# Patient Record
Sex: Female | Born: 1980 | Race: Black or African American | Hispanic: No | Marital: Single | Smoking: Current every day smoker
Health system: Southern US, Community
[De-identification: ages and names within clinical notes are randomized; demographics above are authoritative.]

## PROBLEM LIST (undated history)

## (undated) ENCOUNTER — Ambulatory Visit (HOSPITAL_COMMUNITY): Disposition: A | Discharge status: Home / Self Care

## (undated) DIAGNOSIS — G43909 Migraine, unspecified, not intractable, without status migrainosus: Secondary | ICD-10-CM

## (undated) DIAGNOSIS — F329 Major depressive disorder, single episode, unspecified: Secondary | ICD-10-CM

## (undated) DIAGNOSIS — F32A Depression, unspecified: Secondary | ICD-10-CM

## (undated) HISTORY — PX: FOOT TENDON SURGERY: SHX958

## (undated) HISTORY — PX: FOOT SURGERY: SHX648

## (undated) HISTORY — DX: Migraine, unspecified, not intractable, without status migrainosus: G43.909

## (undated) HISTORY — PX: CHOLECYSTECTOMY: SHX55

## (undated) HISTORY — PX: TOE SURGERY: SHX1073

---

## 2003-06-23 ENCOUNTER — Other Ambulatory Visit: Payer: Self-pay

## 2003-07-07 ENCOUNTER — Other Ambulatory Visit: Payer: Self-pay

## 2003-10-21 ENCOUNTER — Other Ambulatory Visit: Payer: Self-pay

## 2004-01-27 ENCOUNTER — Other Ambulatory Visit: Payer: Self-pay

## 2004-05-13 ENCOUNTER — Inpatient Hospital Stay: Payer: Self-pay | Admitting: Psychiatry

## 2004-09-04 ENCOUNTER — Emergency Department: Payer: Self-pay | Admitting: Emergency Medicine

## 2004-11-01 ENCOUNTER — Emergency Department: Payer: Self-pay | Admitting: Internal Medicine

## 2004-12-30 ENCOUNTER — Emergency Department: Payer: Self-pay | Admitting: Unknown Physician Specialty

## 2005-02-22 ENCOUNTER — Emergency Department: Payer: Self-pay | Admitting: Emergency Medicine

## 2005-03-08 ENCOUNTER — Emergency Department: Payer: Self-pay | Admitting: Emergency Medicine

## 2005-04-07 ENCOUNTER — Ambulatory Visit: Payer: Self-pay | Admitting: Podiatry

## 2005-04-11 ENCOUNTER — Emergency Department: Payer: Self-pay | Admitting: Emergency Medicine

## 2005-11-19 ENCOUNTER — Emergency Department: Payer: Self-pay | Admitting: Emergency Medicine

## 2005-11-20 ENCOUNTER — Emergency Department: Payer: Self-pay | Admitting: Emergency Medicine

## 2007-03-04 ENCOUNTER — Emergency Department: Payer: Self-pay | Admitting: Emergency Medicine

## 2007-05-31 ENCOUNTER — Emergency Department: Payer: Self-pay | Admitting: Unknown Physician Specialty

## 2007-07-16 ENCOUNTER — Emergency Department: Payer: Self-pay | Admitting: Emergency Medicine

## 2007-07-22 ENCOUNTER — Inpatient Hospital Stay: Payer: Self-pay | Admitting: Unknown Physician Specialty

## 2007-09-02 ENCOUNTER — Observation Stay: Payer: Self-pay | Admitting: Obstetrics & Gynecology

## 2007-10-12 ENCOUNTER — Observation Stay: Payer: Self-pay | Admitting: Unknown Physician Specialty

## 2007-11-01 ENCOUNTER — Observation Stay: Payer: Self-pay

## 2007-11-07 ENCOUNTER — Ambulatory Visit: Payer: Self-pay | Admitting: Family Medicine

## 2008-01-06 ENCOUNTER — Inpatient Hospital Stay: Payer: Self-pay

## 2008-08-09 ENCOUNTER — Emergency Department: Payer: Self-pay | Admitting: Emergency Medicine

## 2008-09-05 ENCOUNTER — Emergency Department: Payer: Self-pay | Admitting: Emergency Medicine

## 2008-11-06 ENCOUNTER — Emergency Department: Payer: Self-pay | Admitting: Internal Medicine

## 2008-12-03 IMAGING — US US OB < 14 WEEKS - US OB TV
1 series · 17 of 26 positions shown · non-contrast
Comparison: none

REASON FOR EXAM: Pelvic pain
COMMENTS:

[Series 1: us ob < 14 weeks - us ob tv · 17 of 26 slices shown]
[im 1/26]
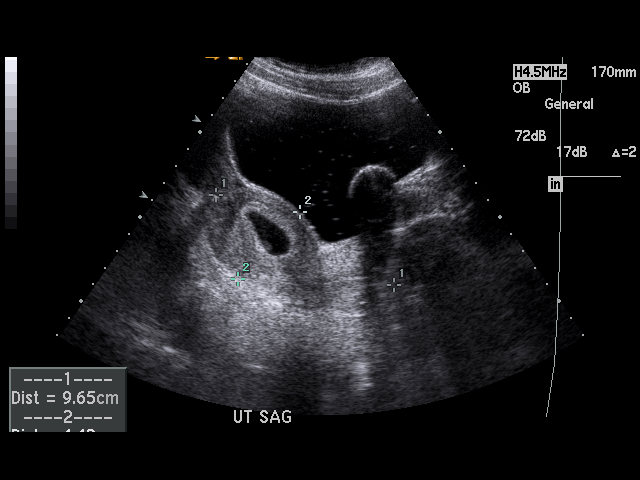
[im 3/26]
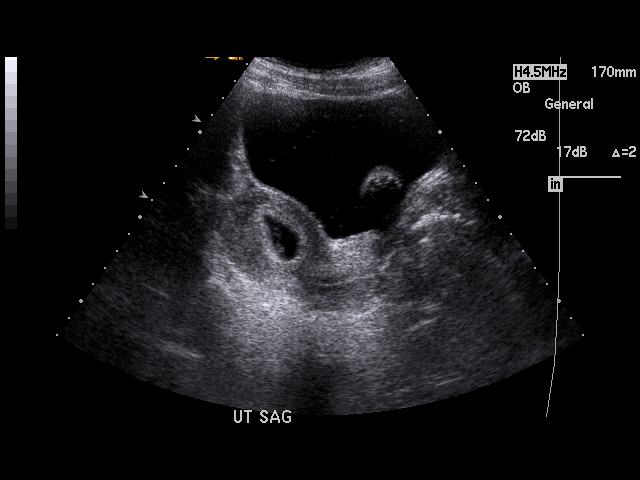
[im 4/26]
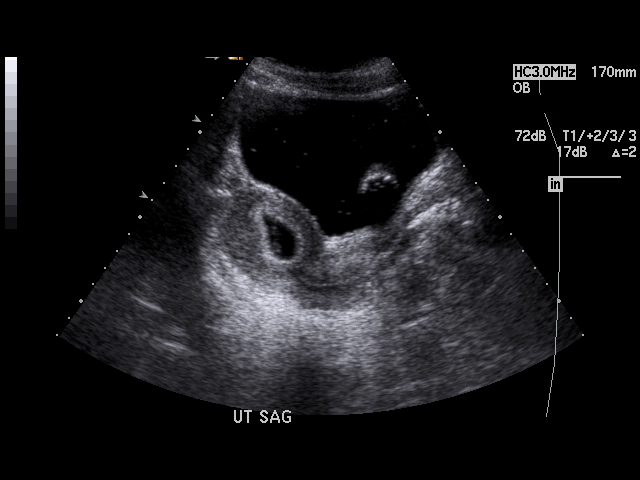
[im 6/26]
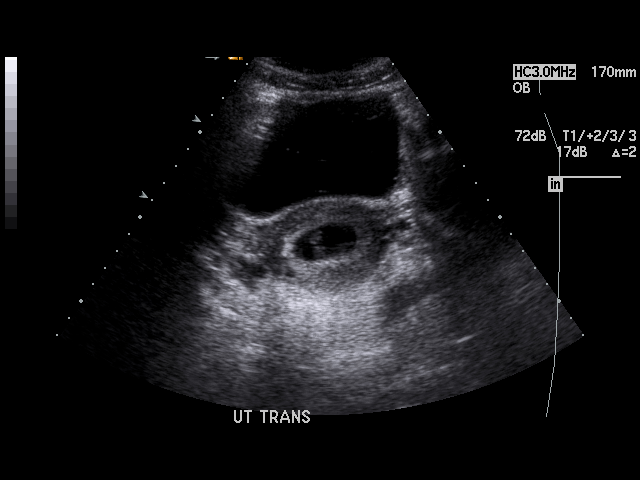
[im 7/26]
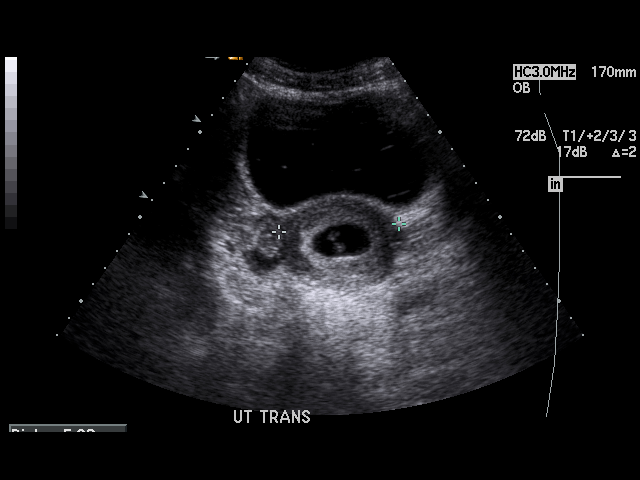
[im 9/26]
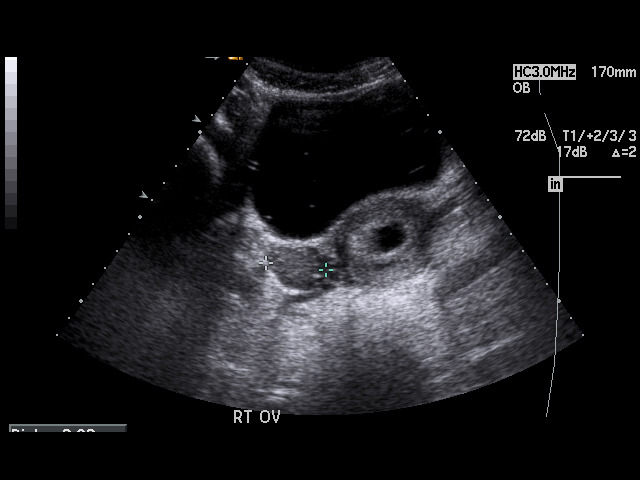
[im 10/26]
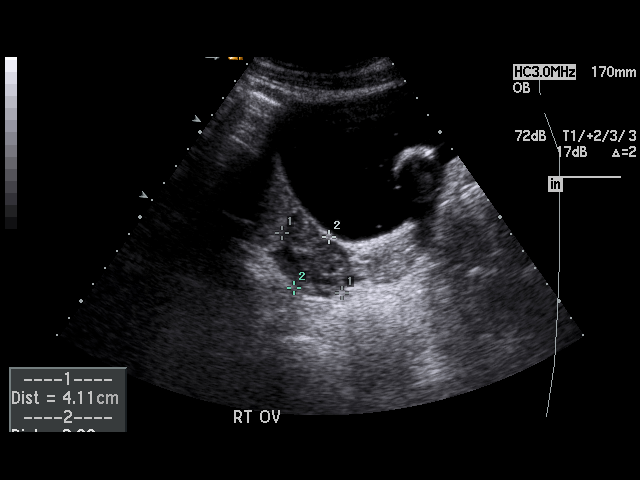
[im 12/26]
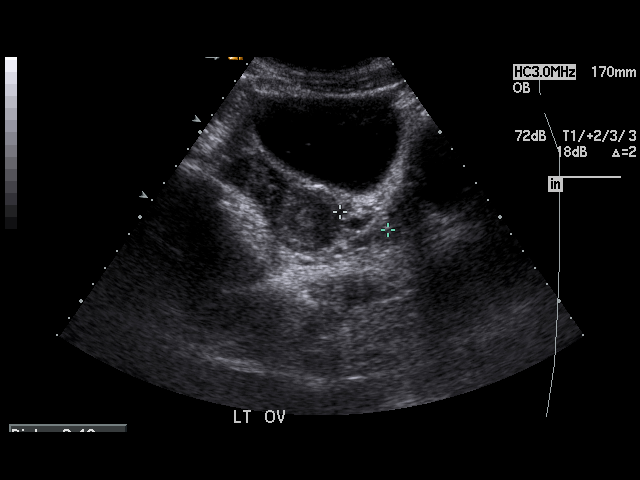
[im 14/26]
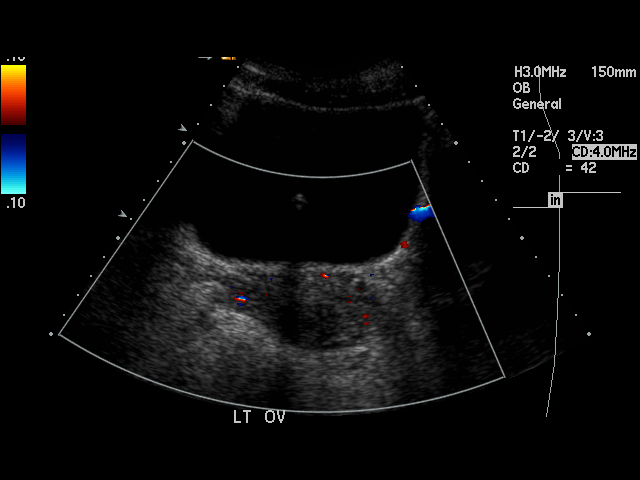
[im 15/26]
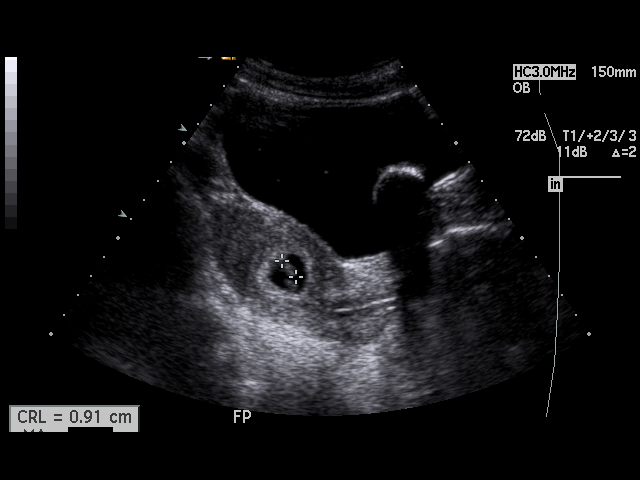
[im 17/26]
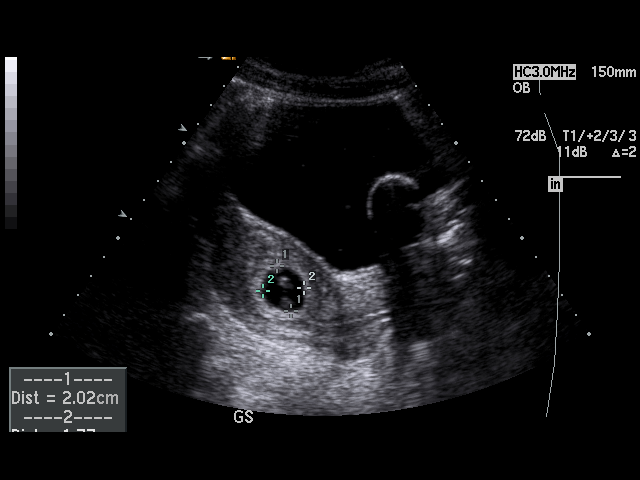
[im 18/26]
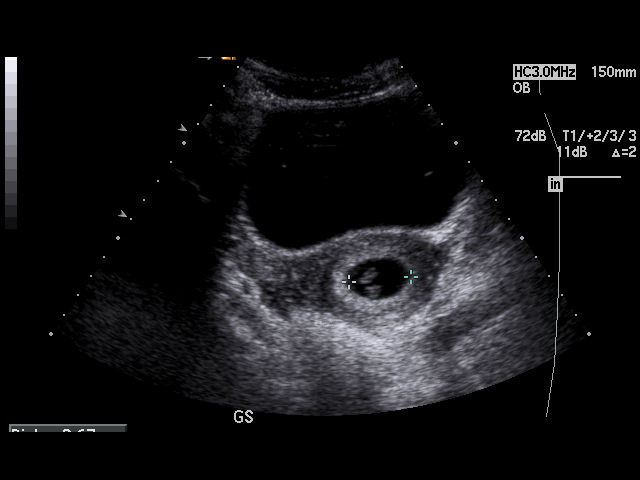
[im 20/26]
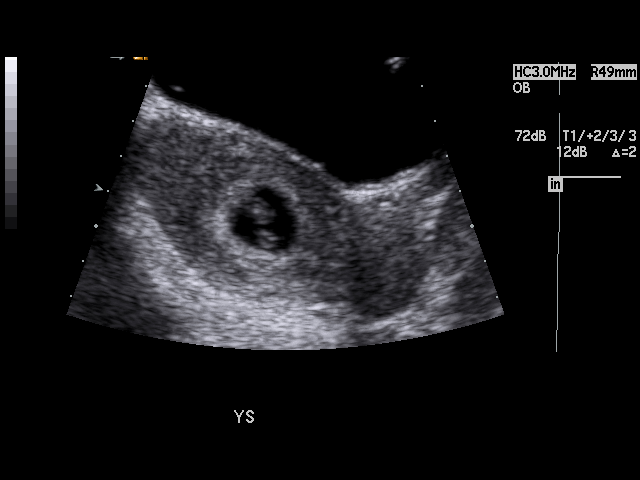
[im 21/26]
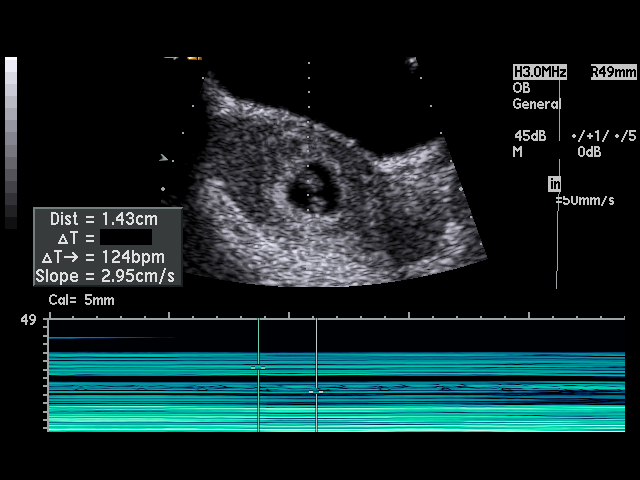
[im 23/26]
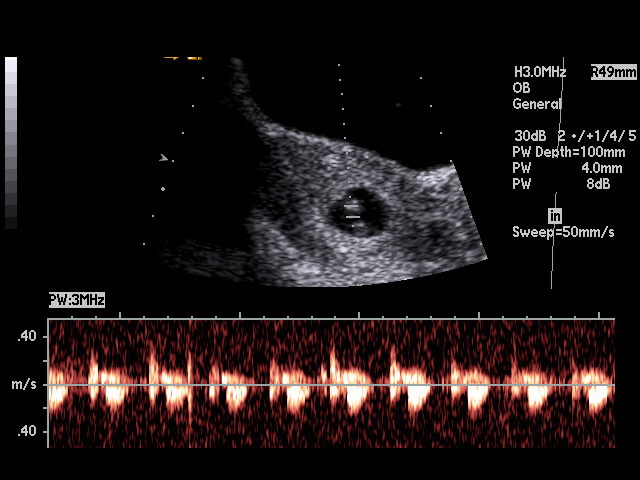
[im 24/26]
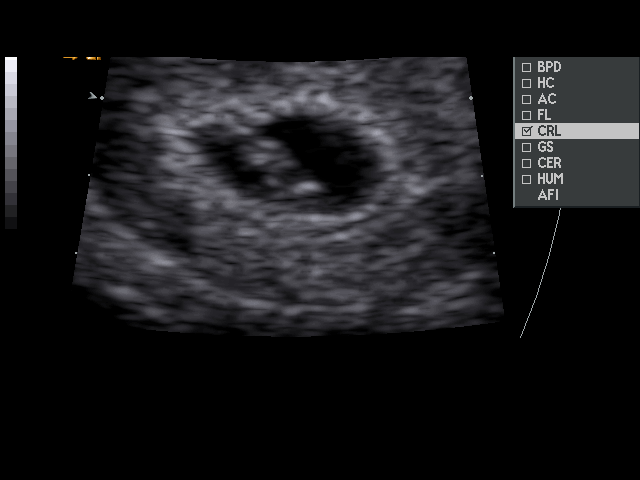
[im 26/26]
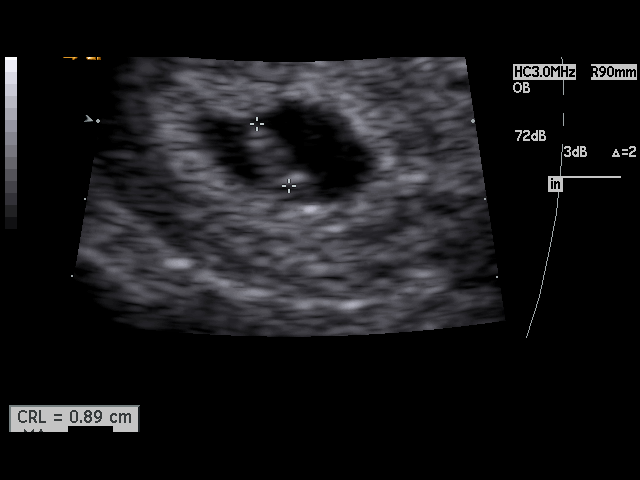

[17 of 26 positions shown; findings below may reference images not displayed]

PROCEDURE:     US  - US OB LESS THAN 14 WEEKS  - May 31, 2007 [DATE]

RESULT:     There is observed a single living intrauterine gestation. Embryo
heart rate was monitored at 124 beats per minute. The crown-rump length
measures 0.91 cm which corresponds to an estimated gestational age of 7
weeks, 0 days. Ultrasound EDD is 01/17/2008.

The maternal ovaries are visualized and are normal in appearance. No
abnormal adnexal masses are seen. There is no free fluid in the pelvis. The
visualized portion of the urinary bladder shows no significant abnormalities.
IMPRESSION: 1.  Living intrauterine gestation of approximately 7 weeks, 0 days
gestational age.
2.  Embryo heart rate was monitored at 124 beats per minute.

## 2009-05-02 ENCOUNTER — Observation Stay: Payer: Self-pay | Admitting: Obstetrics and Gynecology

## 2009-05-23 ENCOUNTER — Inpatient Hospital Stay: Payer: Self-pay

## 2009-06-01 ENCOUNTER — Emergency Department: Payer: Self-pay | Admitting: Emergency Medicine

## 2009-08-04 ENCOUNTER — Emergency Department: Payer: Self-pay | Admitting: Emergency Medicine

## 2009-08-12 ENCOUNTER — Ambulatory Visit: Payer: Self-pay | Admitting: Surgery

## 2009-08-19 ENCOUNTER — Ambulatory Visit: Payer: Self-pay | Admitting: Surgery

## 2009-09-08 ENCOUNTER — Emergency Department: Payer: Self-pay | Admitting: Emergency Medicine

## 2010-01-31 ENCOUNTER — Emergency Department: Payer: Self-pay | Admitting: Emergency Medicine

## 2010-04-25 ENCOUNTER — Observation Stay: Payer: Self-pay | Admitting: Psychiatry

## 2011-08-05 ENCOUNTER — Ambulatory Visit: Payer: Self-pay | Admitting: Specialist

## 2012-01-31 ENCOUNTER — Emergency Department (HOSPITAL_COMMUNITY)
Admission: EM | Admit: 2012-01-31 | Discharge: 2012-01-31 | Disposition: A | Discharge status: Home / Self Care | Payer: Medicaid Other | Attending: Emergency Medicine | Admitting: Emergency Medicine

## 2012-01-31 ENCOUNTER — Encounter (HOSPITAL_COMMUNITY): Payer: Self-pay | Admitting: Emergency Medicine

## 2012-01-31 DIAGNOSIS — R110 Nausea: Secondary | ICD-10-CM | POA: Insufficient documentation

## 2012-01-31 DIAGNOSIS — R109 Unspecified abdominal pain: Secondary | ICD-10-CM

## 2012-01-31 DIAGNOSIS — F329 Major depressive disorder, single episode, unspecified: Secondary | ICD-10-CM | POA: Insufficient documentation

## 2012-01-31 DIAGNOSIS — Z9089 Acquired absence of other organs: Secondary | ICD-10-CM | POA: Insufficient documentation

## 2012-01-31 DIAGNOSIS — F3289 Other specified depressive episodes: Secondary | ICD-10-CM | POA: Insufficient documentation

## 2012-01-31 HISTORY — DX: Depression, unspecified: F32.A

## 2012-01-31 HISTORY — DX: Major depressive disorder, single episode, unspecified: F32.9

## 2012-01-31 LAB — DIFFERENTIAL
Basophils Absolute: 0 10*3/uL (ref 0.0–0.1)
Basophils Relative: 0 % (ref 0–1)
Lymphocytes Relative: 27 % (ref 12–46)
Neutro Abs: 3.6 10*3/uL (ref 1.7–7.7)
Neutrophils Relative %: 62 % (ref 43–77)

## 2012-01-31 LAB — COMPREHENSIVE METABOLIC PANEL
Albumin: 3.9 g/dL (ref 3.5–5.2)
Alkaline Phosphatase: 77 U/L (ref 39–117)
BUN: 7 mg/dL (ref 6–23)
Chloride: 104 mEq/L (ref 96–112)
Creatinine, Ser: 0.77 mg/dL (ref 0.50–1.10)
GFR calc Af Amer: >90 mL/min (ref >90)
Glucose, Bld: 94 mg/dL (ref 70–99)
Potassium: 4 mEq/L (ref 3.5–5.1)
Total Bilirubin: 0.3 mg/dL (ref 0.3–1.2)
Total Protein: 7.4 g/dL (ref 6.0–8.3)

## 2012-01-31 LAB — CBC
HCT: 38.1 % (ref 36.0–46.0)
MCHC: 33.3 g/dL (ref 30.0–36.0)
RDW: 11.9 % (ref 11.5–15.5)
WBC: 5.8 10*3/uL (ref 4.0–10.5)

## 2012-01-31 LAB — URINALYSIS, ROUTINE W REFLEX MICROSCOPIC
Bilirubin Urine: NEGATIVE
Ketones, ur: NEGATIVE mg/dL
Leukocytes, UA: NEGATIVE
Nitrite: NEGATIVE
Protein, ur: NEGATIVE mg/dL
pH: 7 (ref 5.0–8.0)

## 2012-01-31 LAB — LIPASE, BLOOD: Lipase: 31 U/L (ref 11–59)

## 2012-01-31 MED ORDER — ONDANSETRON HCL 4 MG PO TABS: 8.0000 mg | ORAL_TABLET | Freq: Four times a day (QID) | ORAL | Status: AC

## 2012-01-31 MED ORDER — ONDANSETRON 4 MG PO TBDP: 8.0000 mg | ORAL_TABLET | Freq: Once | ORAL | Status: DC

## 2012-01-31 NOTE — ED Provider Notes (Signed)
History     CSN: [id number]  Arrival date & time 01/31/12  1423   First MD Initiated Contact with Patient 01/31/12 1716      Chief Complaint  Patient presents with  . Abdominal Pain    (Consider location/radiation/quality/duration/timing/severity/associated sxs/prior treatment) HPI Complains of right flank pain onset 2 AM today. Symptoms accompanied by nausea patient has been getting similar pains for 15 years. She reports had her gallbladder taken out in 2011 however continues to get similar pain since. She admits to nausea no vomiting. Last bowel movement yesterday, normal last menstrual period 2008. She feels much improved today over yesterday similar prior episodes resolve spontaneously after approximately one day. Her treatment prior to coming here no other associated symptoms pain is sharp in quality mild at present Past Medical History  Diagnosis Date  . Depression     History reviewed. No pertinent past surgical history. Past surgical Cholecystectomy No family history on file.  History  Substance Use Topics  . Smoking status: Passive Smoker    Types: Cigarettes  . Smokeless tobacco: Not on file  . Alcohol Use: No    OB History    Grav Para Term Preterm Abortions TAB SAB Ect Mult Living                  Review of Systems  Constitutional: Negative.   HENT: Negative.   Respiratory: Negative.   Cardiovascular: Negative.   Gastrointestinal: Positive for nausea.  Genitourinary: Positive for flank pain.       Amenorrhea as she is on Mirena, IUD  Musculoskeletal: Negative.   Skin: Negative.   Neurological: Negative.   Hematological: Negative.   Psychiatric/Behavioral: Negative.   All other systems reviewed and are negative.    Allergies  Keflex  Home Medications   Current Outpatient Rx  Name Route Sig Dispense Refill  . QUETIAPINE FUMARATE 100 MG PO TABS Oral Take 100 mg by mouth at bedtime.    . VENLAFAXINE HCL ER 150 MG PO CP24 Oral Take 150 mg by  mouth every morning.      BP 104/64  Pulse 80  Temp 98.2 F (36.8 C) (Oral)  Resp 16  SpO2 98%  Physical Exam  Nursing note and vitals reviewed. Constitutional: She appears well-developed and well-nourished.  HENT:  Head: Normocephalic and atraumatic.  Eyes: Conjunctivae are normal. Pupils are equal, round, and reactive to light.  Neck: Neck supple. No tracheal deviation present. No thyromegaly present.  Cardiovascular: Normal rate and regular rhythm.   No murmur heard. Pulmonary/Chest: Effort normal and breath sounds normal.  Abdominal: Soft. Bowel sounds are normal. She exhibits no distension. There is no tenderness.  Genitourinary:       Mild right flank tenderness  Musculoskeletal: Normal range of motion. She exhibits no edema and no tenderness.  Neurological: She is alert. Coordination normal.  Skin: Skin is warm and dry. No rash noted.  Psychiatric: She has a normal mood and affect.    ED Course  Procedures (including critical care time) 7:05 PM patient resting comfortably discomfort is minimal without having received any treatment.(She declined pain medicine or antiemetic treatment in the ED)  Labs Reviewed  URINALYSIS, ROUTINE W REFLEX MICROSCOPIC   No results found. Results for orders placed during the hospital encounter of 01/31/12  URINALYSIS, ROUTINE W REFLEX MICROSCOPIC      Component Value Range   Color, Urine YELLOW  YELLOW   APPearance CLEAR  CLEAR   Specific Gravity, Urine 1.010  1.005 -  1.030   pH 7.0  5.0 - 8.0   Glucose, UA NEGATIVE  NEGATIVE mg/dL   Hgb urine dipstick NEGATIVE  NEGATIVE   Bilirubin Urine NEGATIVE  NEGATIVE   Ketones, ur NEGATIVE  NEGATIVE mg/dL   Protein, ur NEGATIVE  NEGATIVE mg/dL   Urobilinogen, UA 0.2  0.0 - 1.0 mg/dL   Nitrite NEGATIVE  NEGATIVE   Leukocytes, UA NEGATIVE  NEGATIVE  PREGNANCY, URINE      Component Value Range   Preg Test, Ur NEGATIVE  NEGATIVE  COMPREHENSIVE METABOLIC PANEL      Component Value Range    Sodium 138  135 - 145 mEq/L   Potassium 4.0  3.5 - 5.1 mEq/L   Chloride 104  96 - 112 mEq/L   CO2 25  19 - 32 mEq/L   Glucose, Bld 94  70 - 99 mg/dL   BUN 7  6 - 23 mg/dL   Creatinine, Ser 1.61  0.50 - 1.10 mg/dL   Calcium 9.1  8.4 - 09.6 mg/dL   Total Protein 7.4  6.0 - 8.3 g/dL   Albumin 3.9  3.5 - 5.2 g/dL   AST 20  0 - 37 U/L   ALT 7  0 - 35 U/L   Alkaline Phosphatase 77  39 - 117 U/L   Total Bilirubin 0.3  0.3 - 1.2 mg/dL   GFR calc non Af Amer >90  >90 mL/min   GFR calc Af Amer >90  >90 mL/min  LIPASE, BLOOD      Component Value Range   Lipase 31  11 - 59 U/L  CBC      Component Value Range   WBC 5.8  4.0 - 10.5 K/uL   RBC 4.07  3.87 - 5.11 MIL/uL   Hemoglobin 12.7  12.0 - 15.0 g/dL   HCT 04.5  40.9 - 81.1 %   MCV 93.6  78.0 - 100.0 fL   MCH 31.2  26.0 - 34.0 pg   MCHC 33.3  30.0 - 36.0 g/dL   RDW 91.4  78.2 - 95.6 %   Platelets 214  150 - 400 K/uL  DIFFERENTIAL      Component Value Range   Neutrophils Relative 62  43 - 77 %   Neutro Abs 3.6  1.7 - 7.7 K/uL   Lymphocytes Relative 27  12 - 46 %   Lymphs Abs 1.6  0.7 - 4.0 K/uL   Monocytes Relative 9  3 - 12 %   Monocytes Absolute 0.5  0.1 - 1.0 K/uL   Eosinophils Relative 2  0 - 5 %   Eosinophils Absolute 0.1  0.0 - 0.7 K/uL   Basophils Relative 0  0 - 1 %   Basophils Absolute 0.0  0.0 - 0.1 K/uL   No results found.   No diagnosis found.    MDM  Assessment symptoms are chronic and long-standing and have been self-limiting in the past and are improving spontaneously Plan prescription Zofran Tylenol as needed for pain followup Dr. Kenard Gower as needed Diagnosis right flank pain        Doug Sou, MD 01/31/12 1913

## 2012-01-31 NOTE — ED Notes (Signed)
Pt. Stated, I started having horrible stomach pains last night and I've already had my gall bladder removed.

## 2012-01-31 NOTE — Discharge Instructions (Signed)
Take Tylenol as directed for pain. Take the medication prescribed as needed for nausea. Return if her condition worsens or if concerned for any reason or followup with Dr. Kenard Gower

## 2012-01-31 NOTE — ED Notes (Signed)
Pt denies nausea at this time.  Requesting something to eat.

## 2012-09-04 LAB — HM HIV SCREENING LAB: HM HIV Screening: NEGATIVE

## 2013-10-29 LAB — HM PAP SMEAR: HM Pap smear: NEGATIVE

## 2013-11-14 ENCOUNTER — Emergency Department: Payer: Self-pay | Admitting: Emergency Medicine

## 2013-11-16 LAB — BETA STREP CULTURE(ARMC)

## 2014-01-30 ENCOUNTER — Ambulatory Visit: Payer: Self-pay | Admitting: Family Medicine

## 2015-09-04 ENCOUNTER — Emergency Department
Admission: EM | Admit: 2015-09-04 | Discharge: 2015-09-04 | Disposition: A | Discharge status: Home / Self Care | Payer: Medicaid Other | Attending: Emergency Medicine | Admitting: Emergency Medicine

## 2015-09-04 ENCOUNTER — Encounter: Payer: Self-pay | Admitting: Emergency Medicine

## 2015-09-04 ENCOUNTER — Emergency Department: Payer: Medicaid Other

## 2015-09-04 DIAGNOSIS — K5900 Constipation, unspecified: Secondary | ICD-10-CM | POA: Diagnosis not present

## 2015-09-04 DIAGNOSIS — Z79899 Other long term (current) drug therapy: Secondary | ICD-10-CM | POA: Insufficient documentation

## 2015-09-04 DIAGNOSIS — F17210 Nicotine dependence, cigarettes, uncomplicated: Secondary | ICD-10-CM | POA: Diagnosis not present

## 2015-09-04 DIAGNOSIS — Z3202 Encounter for pregnancy test, result negative: Secondary | ICD-10-CM | POA: Diagnosis not present

## 2015-09-04 DIAGNOSIS — R1084 Generalized abdominal pain: Secondary | ICD-10-CM

## 2015-09-04 DIAGNOSIS — R109 Unspecified abdominal pain: Secondary | ICD-10-CM | POA: Diagnosis present

## 2015-09-04 LAB — URINALYSIS COMPLETE WITH MICROSCOPIC (ARMC ONLY)
BILIRUBIN URINE: NEGATIVE
GLUCOSE, UA: NEGATIVE mg/dL
HGB URINE DIPSTICK: NEGATIVE
Ketones, ur: NEGATIVE mg/dL
NITRITE: NEGATIVE
Protein, ur: NEGATIVE mg/dL
RBC / HPF: NONE SEEN RBC/hpf (ref 0–5)
SPECIFIC GRAVITY, URINE: 1.014 (ref 1.005–1.030)
pH: 6 (ref 5.0–8.0)

## 2015-09-04 MED ORDER — POLYETHYLENE GLYCOL 3350 17 G PO PACK: 17.0000 g | PACK | Freq: Every day | ORAL | Status: DC | PRN

## 2015-09-04 MED ORDER — DOCUSATE SODIUM 100 MG PO CAPS
100.0000 mg | ORAL_CAPSULE | Freq: Once | ORAL | Status: AC
  Administered 2015-09-04: 100 mg via ORAL
  Filled 2015-09-04: qty 1

## 2015-09-04 MED ORDER — LACTULOSE 10 GM/15ML PO SOLN: 10.0000 g | Freq: Once | ORAL | Status: DC

## 2015-09-04 MED ORDER — MAGNESIUM CITRATE PO SOLN
0.5000 | Freq: Once | ORAL | Status: AC
  Administered 2015-09-04: 0.5 via ORAL
  Filled 2015-09-04: qty 296

## 2015-09-04 NOTE — ED Notes (Signed)
POCT urine pregnancy negative 

## 2015-09-04 NOTE — ED Notes (Signed)
Pt reports large bowel movement after SSE.

## 2015-09-04 NOTE — Discharge Instructions (Signed)
Abdominal Pain, Adult °Many things can cause abdominal pain. Usually, abdominal pain is not caused by a disease and will improve without treatment. It can often be observed and treated at home. Your health care provider will do a physical exam and possibly order blood tests and X-rays to help determine the seriousness of your pain. However, in many cases, more time must pass before a clear cause of the pain can be found. Before that point, your health care provider may not know if you need more testing or further treatment. °HOME CARE INSTRUCTIONS °Monitor your abdominal pain for any changes. The following actions may help to alleviate any discomfort you are experiencing: °· Only take over-the-counter or prescription medicines as directed by your health care provider. °· Do not take laxatives unless directed to do so by your health care provider. °· Try a clear liquid diet (broth, tea, or water) as directed by your health care provider. Slowly move to a bland diet as tolerated. °SEEK MEDICAL CARE IF: °· You have unexplained abdominal pain. °· You have abdominal pain associated with nausea or diarrhea. °· You have pain when you urinate or have a bowel movement. °· You experience abdominal pain that wakes you in the night. °· You have abdominal pain that is worsened or improved by eating food. °· You have abdominal pain that is worsened with eating fatty foods. °· You have a fever. °SEEK IMMEDIATE MEDICAL CARE IF: °· Your pain does not go away within 2 hours. °· You keep throwing up (vomiting). °· Your pain is felt only in portions of the abdomen, such as the right side or the left lower portion of the abdomen. °· You pass bloody or black tarry stools. °MAKE SURE YOU: °· Understand these instructions. °· Will watch your condition. °· Will get help right away if you are not doing well or get worse. °  °This information is not intended to replace advice given to you by your health care provider. Make sure you discuss  any questions you have with your health care provider. °  °Document Released: 05/11/2005 Document Revised: 04/22/2015 Document Reviewed: 04/10/2013 °Elsevier Interactive Patient Education ©2016 [employer] ° °Constipation, Adult °Constipation is when a person: °· Poops (has a bowel movement) less than 3 times a week. °· Has a hard time pooping. °· Has poop that is dry, hard, or bigger than normal. °HOME CARE  °· Eat foods with a lot of fiber in them. This includes fruits, vegetables, beans, and whole grains such as brown rice. °· Avoid fatty foods and foods with a lot of sugar. This includes french fries, hamburgers, cookies, candy, and soda. °· If you are not getting enough fiber from food, take products with added fiber in them (supplements). °· Drink enough fluid to keep your pee (urine) clear or pale yellow. °· Exercise on a regular basis, or as told by your doctor. °· Go to the restroom when you feel like you need to poop. Do not hold it. °· Only take medicine as told by your doctor. Do not take medicines that help you poop (laxatives) without talking to your doctor first. °GET HELP RIGHT AWAY IF:  °· You have bright red blood in your poop (stool). °· Your constipation lasts more than 4 days or gets worse. °· You have belly (abdominal) or butt (rectal) pain. °· You have thin poop (as thin as a pencil). °· You lose weight, and it cannot be explained. °MAKE SURE YOU:  °· Understand these instructions. °·   Will watch your condition. °· Will get help right away if you are not doing well or get worse. °  °This information is not intended to replace advice given to you by your health care provider. Make sure you discuss any questions you have with your health care provider. °  °Document Released: 01/18/2008 Document Revised: 08/22/2014 Document Reviewed: 05/13/2013 °Elsevier Interactive Patient Education ©2016 [employer] ° °

## 2015-09-04 NOTE — ED Notes (Signed)
C/o mostly RUQ abdominal pain since yesterday with nausea but has had some generalized abdominal pain. Reports has had this on and off since gallbladder out in 2013

## 2015-09-04 NOTE — ED Provider Notes (Signed)
[facility] Emergency Department Provider Note  ____________________________________________  Time seen: Approximately 1035 AM  I have reviewed the triage vital signs and the nursing notes.   HISTORY  Chief Complaint Abdominal Pain    HPI Paula Shelton is a 35 y.o. female with a history of a cholecystectomy several years ago who is presenting today with 1 week of difficulty with moving her bowels. She said that she did have a small bowel movement yesterday morning but it was pellet like. She said that she drinks water but otherwise does not eat a high-fiber diet. She said that as of last night she began to have cramping and diffuse abdominal pain. This was associated with nausea but with no vomiting. Denies any vaginal bleeding or discharge. Denies any difficulty with urination. Is not tried any laxatives or any other aids at home to help her move her bowels.Says her belly also appears distended to her.   Past Medical History  Diagnosis Date  . Depression     There are no active problems to display for this patient.   Past Surgical History  Procedure Laterality Date  . Cholecystectomy    . Foot surgery    . Toe surgery      Current Outpatient Rx  Name  Route  Sig  Dispense  Refill  . QUEtiapine (SEROQUEL XR) 50 MG TB24 24 hr tablet   Oral   Take 1 tablet by mouth 2 times daily at 12 noon and 4 pm.      0   . QUEtiapine (SEROQUEL) 100 MG tablet   Oral   Take 100 mg by mouth at bedtime.         [name] venlafaxine XR (EFFEXOR-XR) 150 MG 24 hr capsule   Oral   Take 150 mg by mouth every morning.           Allergies Keflex  History reviewed. No pertinent family history.  Social History Social History  Substance Use Topics  . Smoking status: Current Some Day Smoker    Types: Cigarettes  . Smokeless tobacco: None  . Alcohol Use: No    Review of Systems Constitutional: No fever/chills Eyes: No visual changes. ENT: No sore  throat. Cardiovascular: Denies chest pain. Respiratory: Denies shortness of breath. Gastrointestinal:no vomiting.  No diarrhea.  No constipation. Genitourinary: Negative for dysuria. Musculoskeletal: Negative for back pain. Skin: Negative for rash. Neurological: Negative for headaches, focal weakness or numbness.  10-point ROS otherwise negative.  ____________________________________________   PHYSICAL EXAM:  VITAL SIGNS: ED Triage Vitals  Enc Vitals Group     BP 09/04/15 0944 104/59 mmHg     Pulse Rate 09/04/15 0944 92     Resp 09/04/15 0944 17     Temp 09/04/15 0944 98.2 F (36.8 C)     Temp Source 09/04/15 0944 Oral     SpO2 09/04/15 0944 97 %     Weight 09/04/15 0944 194 lb (87.998 kg)     Height 09/04/15 0944  (1.727 m)     Head Cir --      Peak Flow --      Pain Score 09/04/15 0945 7     Pain Loc --      Pain Edu? --      Excl. in GC? --     Constitutional: Alert and oriented. Well appearing and in no acute distress. Eyes: Conjunctivae are normal. PERRL. EOMI. Head: Atraumatic. Nose: No congestion/rhinnorhea. Mouth/Throat: Mucous membranes are moist.  Neck: No stridor.   Cardiovascular: Normal rate, regular rhythm. Grossly normal heart sounds.  Good peripheral circulation. Respiratory: Normal respiratory effort.  No retractions. Lungs CTAB. Gastrointestinal: Soft with diffuse mild tenderness to palpation. No distention. No abdominal bruits. No CVA tenderness. Rectal exam without fecal impaction. Musculoskeletal: No lower extremity tenderness nor edema.  No joint effusions. Neurologic:  Normal speech and language. No gross focal neurologic deficits are appreciated. No gait instability. Skin:  Skin is warm, dry and intact. No rash noted. Psychiatric: Mood and affect are normal. Speech and behavior are normal.  ____________________________________________   LABS (all labs ordered are listed, but only abnormal results are displayed)  Labs Reviewed   URINALYSIS COMPLETEWITH MICROSCOPIC ([facility] ONLY) - Abnormal; Notable for the following:    Color, Urine YELLOW (*)    APPearance HAZY (*)    Leukocytes, UA TRACE (*)    Bacteria, UA RARE (*)    Squamous Epithelial / LPF 6-30 (*)    All other components within normal limits  POC URINE PREG, ED   ____________________________________________  EKG   ____________________________________________  RADIOLOGY  Mild dilation of the small bowel although no definitive obstruction seen. Mild amount of fecal material within the colon. ____________________________________________   PROCEDURES    ____________________________________________   INITIAL IMPRESSION / ASSESSMENT AND PLAN / ED COURSE  Pertinent labs & imaging results that were available during my care of the patient were reviewed by me and considered in my medical decision making (see chart for details).  ----------------------------------------- 2:23 PM on 09/04/2015 -----------------------------------------  Patient says that she is feeling much improved after the enema and having a bowel movement. She still is some mild tenderness to the lower abdomen but is soft throughout and nontender to the upper abdomen. Furthermore, I discussed the nurses said that the patient had a negative pregnancy test and this will be documented in the nursing note. Discussed with the patient to return immediately if any worsening or concerning symptoms especially increased abdominal pain or vomiting. I will be discharging with MiraLAX. She knows that she shouldn't drink plenty of water as well as increase the amount of fruits and vegetables in her diet.  ____________________________________________   FINAL CLINICAL IMPRESSION(S) / ED DIAGNOSES  Abdominal pain. Constipation.    Myrna Blazer, MD 09/04/15 [phone]

## 2015-09-04 NOTE — ED Notes (Signed)
SSE administered. Pt tolerated without difficulty.

## 2016-08-15 HISTORY — PX: ADRENALECTOMY: SHX876

## 2017-01-21 DIAGNOSIS — Z8659 Personal history of other mental and behavioral disorders: Secondary | ICD-10-CM | POA: Insufficient documentation

## 2017-04-21 DIAGNOSIS — E896 Postprocedural adrenocortical (-medullary) hypofunction: Secondary | ICD-10-CM | POA: Insufficient documentation

## 2017-05-16 DIAGNOSIS — Z8659 Personal history of other mental and behavioral disorders: Secondary | ICD-10-CM | POA: Insufficient documentation

## 2017-05-16 DIAGNOSIS — F418 Other specified anxiety disorders: Secondary | ICD-10-CM | POA: Insufficient documentation

## 2017-09-20 ENCOUNTER — Encounter: Payer: Self-pay | Admitting: Emergency Medicine

## 2017-09-20 ENCOUNTER — Emergency Department: Payer: Medicaid Other

## 2017-09-20 ENCOUNTER — Other Ambulatory Visit: Payer: Self-pay

## 2017-09-20 DIAGNOSIS — Z79899 Other long term (current) drug therapy: Secondary | ICD-10-CM | POA: Diagnosis not present

## 2017-09-20 DIAGNOSIS — M940 Chondrocostal junction syndrome [Tietze]: Secondary | ICD-10-CM | POA: Insufficient documentation

## 2017-09-20 DIAGNOSIS — R0789 Other chest pain: Secondary | ICD-10-CM | POA: Diagnosis present

## 2017-09-20 DIAGNOSIS — F17210 Nicotine dependence, cigarettes, uncomplicated: Secondary | ICD-10-CM | POA: Insufficient documentation

## 2017-09-20 LAB — BASIC METABOLIC PANEL
Anion gap: 7 (ref 5–15)
BUN: 11 mg/dL (ref 6–20)
CHLORIDE: 108 mmol/L (ref 101–111)
CO2: 25 mmol/L (ref 22–32)
Calcium: 8.7 mg/dL — ABNORMAL LOW (ref 8.9–10.3)
Creatinine, Ser: 0.97 mg/dL (ref 0.44–1.00)
GFR calc Af Amer: >60 mL/min (ref >60)
Glucose, Bld: 102 mg/dL — ABNORMAL HIGH (ref 65–99)
POTASSIUM: 3.6 mmol/L (ref 3.5–5.1)
SODIUM: 140 mmol/L (ref 135–145)

## 2017-09-20 LAB — CBC
HEMATOCRIT: 38 % (ref 35.0–47.0)
HEMOGLOBIN: 12.8 g/dL (ref 12.0–16.0)
MCH: 31.4 pg (ref 26.0–34.0)
MCHC: 33.6 g/dL (ref 32.0–36.0)
MCV: 93.6 fL (ref 80.0–100.0)
PLATELETS: 204 10*3/uL (ref 150–440)
RBC: 4.06 MIL/uL (ref 3.80–5.20)
RDW: 13.6 % (ref 11.5–14.5)
WBC: 8 10*3/uL (ref 3.6–11.0)

## 2017-09-20 LAB — TROPONIN I

## 2017-09-20 NOTE — ED Triage Notes (Signed)
Patient ambulatory to triage with steady gait, without difficulty or distress noted; pt reports having left sided CP since August, nonradiating accomp by dizziness; [facility] dx

## 2017-09-21 ENCOUNTER — Emergency Department
Admission: EM | Admit: 2017-09-21 | Discharge: 2017-09-21 | Disposition: A | Discharge status: Home / Self Care | Payer: Medicaid Other | Attending: Emergency Medicine | Admitting: Emergency Medicine

## 2017-09-21 DIAGNOSIS — M940 Chondrocostal junction syndrome [Tietze]: Secondary | ICD-10-CM

## 2017-09-21 LAB — FIBRIN DERIVATIVES D-DIMER (ARMC ONLY): Fibrin derivatives D-dimer (ARMC): 220.14 ng/mL (FEU) (ref 0.00–499.00)

## 2017-09-21 NOTE — ED Notes (Signed)
Pt CP that wraps around her left breast. Pt stating that it has been going on for three weeks "this time." Pt stating that she had a tumor removed on her left side before for the pain bu that it never went away. Pt stating SOB with exertion. Pt stating denying nausea or fevers. Pt in NAD at this time. Pt stating pain is normally dull but sometimes turns sharp.

## 2017-09-21 NOTE — ED Provider Notes (Signed)
[facility] Emergency Department Provider Note ___   First MD Initiated Contact with Patient 09/21/17 0050     (approximate)  I have reviewed the triage vital signs and the nursing notes.   HISTORY  Chief Complaint Chest Pain    HPI Paula Shelton is a 37 y.o. female with past medical history of a benign adrenal mass and depression presents to the emergency department with history of intermittent chest pain since August of last year.  Patient describes the pain as sharp stating the current pain score is 6 out of 10.  Patient denies any dyspnea nausea or vomiting.  Patient states that she has been seen here and at [facility] same without any diagnosis given.   Past Medical History:  Diagnosis Date  . Depression     There are no active problems to display for this patient.   Past Surgical History:  Procedure Laterality Date  . CHOLECYSTECTOMY    . FOOT SURGERY    . TOE SURGERY      Prior to Admission medications   Medication Sig Start Date End Date Taking? Authorizing Provider  polyethylene glycol (MIRALAX) packet Take 17 g by mouth daily as needed for mild constipation or moderate constipation. 09/04/15   Myrna BlazerSchaevitz, David Matthew, MD  QUEtiapine (SEROQUEL XR) 50 MG TB24 24 hr tablet Take 1 tablet by mouth 2 times daily at 12 noon and 4 pm. 08/24/15   [provider]  QUEtiapine (SEROQUEL) 100 MG tablet Take 100 mg by mouth at bedtime.    [provider]  venlafaxine XR (EFFEXOR-XR) 150 MG 24 hr capsule Take 150 mg by mouth every morning.    [provider]    Allergies Keflex [cephalexin]  No family history on file.  Social History Social History   Tobacco Use  . Smoking status: Current Some Day Smoker    Types: Cigarettes  . Smokeless tobacco: Never Used  Substance Use Topics  . Alcohol use: No  . Drug use: No    Review of Systems Constitutional: No fever/chills Eyes: No visual changes. ENT: No sore  throat. Cardiovascular: Positive for chest pain. Respiratory: Denies shortness of breath. Gastrointestinal: No abdominal pain.  No nausea, no vomiting.  No diarrhea.  No constipation. Genitourinary: Negative for dysuria. Musculoskeletal: Negative for neck pain.  Negative for back pain. Integumentary: Negative for rash. Neurological: Negative for headaches, focal weakness or numbness.  ____________________________________________   PHYSICAL EXAM:  VITAL SIGNS: ED Triage Vitals  Enc Vitals Group     BP 09/20/17 2141 113/70     Pulse Rate 09/20/17 2141 74     Resp 09/20/17 2141 20     Temp 09/20/17 2141 97.8 F (36.6 C)     Temp Source 09/20/17 2141 Oral     SpO2 09/20/17 2141 100 %     Weight 09/20/17 2142 96.6 kg (213 lb)     Height 09/20/17 2142 1.727 m (5\' 8" )     Head Circumference --      Peak Flow --      Pain Score 09/20/17 2142 6     Pain Loc --      Pain Edu? --      Excl. in GC? --     Constitutional: Alert and oriented. Well appearing and in no acute distress. Eyes: Conjunctivae are normal.  Head: Atraumatic. Mouth/Throat: Mucous membranes are moist.  Oropharynx non-erythematous. Neck: No stridor.  Cardiovascular: Normal rate, regular rhythm. Good peripheral circulation.  Grossly normal heart sounds. Respiratory: Normal respiratory effort.  No retractions. Lungs CTAB. Gastrointestinal: Soft and nontender. No distention.   Musculoskeletal: No lower extremity tenderness nor edema. No gross deformities of extremities. Neurologic:  Normal speech and language. No gross focal neurologic deficits are appreciated.  Skin:  Skin is warm, dry and intact. No rash noted. Psychiatric: Mood and affect are normal. Speech and behavior are normal.  ____________________________________________   LABS (all labs ordered are listed, but only abnormal results are displayed)  Labs Reviewed  BASIC METABOLIC PANEL - Abnormal; Notable for the following components:      Result Value    Glucose, Bld 102 (*)    Calcium 8.7 (*)    All other components within normal limits  CBC  TROPONIN I  FIBRIN DERIVATIVES D-DIMER ([facility] ONLY)   ____________________________________________  EKG  ED ECG REPORT I, [place] N BROWN, the attending physician, personally viewed and interpreted this ECG.   Date: 09/21/2017  EKG Time: 9:46 PM  Rate: 78  Rhythm: Normal sinus rhythm  Axis: Normal  Intervals: Normal  ST&T Change: None  ____________________________________________  RADIOLOGY I, [place] N BROWN, personally viewed and evaluated these images (plain radiographs) as part of my medical decision making, as well as reviewing the written report by the radiologist.  ED MD interpretation: No acute findings noted on chest x-ray.  Official radiology report(s): Dg Chest 2 View  Result Date: 09/20/2017 CLINICAL DATA:  Initial evaluation for acute left-sided chest pain since August. EXAM: CHEST  2 VIEW COMPARISON:  Prior radiograph from 11/14/2013. FINDINGS: The cardiac and mediastinal silhouettes are stable in size and contour, and remain within normal limits. The lungs are normally inflated. No airspace consolidation, pleural effusion, or pulmonary edema is identified. There is no pneumothorax. No acute osseous abnormality identified. IMPRESSION: No active cardiopulmonary disease. Electronically Signed   By: Rise Mu M.D.   On: 09/20/2017 22:07     Procedures   ____________________________________________   INITIAL IMPRESSION / ASSESSMENT AND PLAN / ED COURSE  As part of my medical decision making, I reviewed the following data within the electronic MEDICAL RECORD NUMBER13 year old female presented with above-stated history and physical exam chest pain since August of last year.  Consider the possibility of cardiac etiology however EKG revealed no acuTE abnormalities troponin negative x1.  Consider possibly pulmonary emboli however d-dimer and a low risk patient is  normal.  Patient with reproducible chest pain with palpation and as such suspect costochondritis ____________________________________________  FINAL CLINICAL IMPRESSION(S) / ED DIAGNOSES  Final diagnoses:  Costochondritis, acute     MEDICATIONS GIVEN DURING THIS VISIT:  Medications - No data to display   ED Discharge Orders    None       Note:  This document was prepared using Dragon voice recognition software and may include unintentional dictation errors.    Darci Current, MD 09/21/17 [phone]

## 2019-03-06 ENCOUNTER — Encounter (HOSPITAL_COMMUNITY): Payer: Self-pay

## 2019-03-06 ENCOUNTER — Emergency Department (HOSPITAL_COMMUNITY)
Admission: EM | Admit: 2019-03-06 | Discharge: 2019-03-07 | Disposition: A | Discharge status: Home / Self Care | Payer: Medicaid Other | Attending: Emergency Medicine | Admitting: Emergency Medicine

## 2019-03-06 ENCOUNTER — Other Ambulatory Visit: Payer: Self-pay

## 2019-03-06 DIAGNOSIS — R51 Headache: Secondary | ICD-10-CM | POA: Diagnosis not present

## 2019-03-06 DIAGNOSIS — R519 Headache, unspecified: Secondary | ICD-10-CM

## 2019-03-06 DIAGNOSIS — R200 Anesthesia of skin: Secondary | ICD-10-CM | POA: Diagnosis not present

## 2019-03-06 DIAGNOSIS — M542 Cervicalgia: Secondary | ICD-10-CM | POA: Diagnosis not present

## 2019-03-06 DIAGNOSIS — Z720 Tobacco use: Secondary | ICD-10-CM | POA: Insufficient documentation

## 2019-03-06 DIAGNOSIS — R42 Dizziness and giddiness: Secondary | ICD-10-CM | POA: Diagnosis not present

## 2019-03-06 LAB — I-STAT BETA HCG BLOOD, ED (MC, WL, AP ONLY): I-stat hCG, quantitative: <5 m[IU]/mL (ref <5)

## 2019-03-06 MED ORDER — SODIUM CHLORIDE 0.9% FLUSH
3.0000 mL | Freq: Once | INTRAVENOUS | Status: DC
Start: 2019-03-06 — End: 2019-03-07

## 2019-03-06 NOTE — ED Triage Notes (Signed)
Pt states that she has had a headache all days, hx of migraines for the past few years, states this is the worst, C/o of photosensitivity and making her dizzy, neuro intact bilaterally

## 2019-03-07 ENCOUNTER — Emergency Department (HOSPITAL_COMMUNITY): Payer: Medicaid Other

## 2019-03-07 LAB — CBC
HCT: 36.6 % (ref 36.0–46.0)
Hemoglobin: 12.2 g/dL (ref 12.0–15.0)
MCH: 31.6 pg (ref 26.0–34.0)
MCHC: 33.3 g/dL (ref 30.0–36.0)
MCV: 94.8 fL (ref 80.0–100.0)
Platelets: 264 10*3/uL (ref 150–400)
RBC: 3.86 MIL/uL — ABNORMAL LOW (ref 3.87–5.11)
RDW: 12.6 % (ref 11.5–15.5)
WBC: 8.7 10*3/uL (ref 4.0–10.5)
nRBC: 0 % (ref 0.0–0.2)

## 2019-03-07 LAB — DIFFERENTIAL
Abs Immature Granulocytes: 0.04 10*3/uL (ref 0.00–0.07)
Basophils Absolute: 0 10*3/uL (ref 0.0–0.1)
Basophils Relative: 1 %
Eosinophils Absolute: 0.2 10*3/uL (ref 0.0–0.5)
Eosinophils Relative: 2 %
Immature Granulocytes: 1 %
Lymphocytes Relative: 23 %
Lymphs Abs: 2 10*3/uL (ref 0.7–4.0)
Monocytes Absolute: 0.8 10*3/uL (ref 0.1–1.0)
Monocytes Relative: 9 %
Neutro Abs: 5.7 10*3/uL (ref 1.7–7.7)
Neutrophils Relative %: 64 %

## 2019-03-07 LAB — COMPREHENSIVE METABOLIC PANEL
ALT: 22 U/L (ref 0–44)
AST: 30 U/L (ref 15–41)
Albumin: 3.5 g/dL (ref 3.5–5.0)
Alkaline Phosphatase: 56 U/L (ref 38–126)
Anion gap: 7 (ref 5–15)
BUN: 9 mg/dL (ref 6–20)
CO2: 23 mmol/L (ref 22–32)
Calcium: 8.8 mg/dL — ABNORMAL LOW (ref 8.9–10.3)
Chloride: 108 mmol/L (ref 98–111)
Creatinine, Ser: 0.89 mg/dL (ref 0.44–1.00)
GFR calc Af Amer: >60 mL/min (ref >60)
GFR calc non Af Amer: >60 mL/min (ref >60)
Glucose, Bld: 119 mg/dL — ABNORMAL HIGH (ref 70–99)
Potassium: 3.5 mmol/L (ref 3.5–5.1)
Sodium: 138 mmol/L (ref 135–145)
Total Bilirubin: 0.2 mg/dL — ABNORMAL LOW (ref 0.3–1.2)
Total Protein: 6.4 g/dL — ABNORMAL LOW (ref 6.5–8.1)

## 2019-03-07 LAB — I-STAT CHEM 8, ED
BUN: 9 mg/dL (ref 6–20)
Calcium, Ion: 1.2 mmol/L (ref 1.15–1.40)
Chloride: 105 mmol/L (ref 98–111)
Creatinine, Ser: 0.9 mg/dL (ref 0.44–1.00)
Glucose, Bld: 117 mg/dL — ABNORMAL HIGH (ref 70–99)
HCT: 36 % (ref 36.0–46.0)
Hemoglobin: 12.2 g/dL (ref 12.0–15.0)
Potassium: 3.5 mmol/L (ref 3.5–5.1)
Sodium: 139 mmol/L (ref 135–145)
TCO2: 27 mmol/L (ref 22–32)

## 2019-03-07 LAB — APTT: aPTT: 29 seconds (ref 24–36)

## 2019-03-07 LAB — PROTIME-INR
INR: 1.1 (ref 0.8–1.2)
Prothrombin Time: 13.6 seconds (ref 11.4–15.2)

## 2019-03-07 MED ORDER — PROCHLORPERAZINE EDISYLATE 10 MG/2ML IJ SOLN
10.0000 mg | Freq: Once | INTRAMUSCULAR | Status: AC
  Administered 2019-03-07: 10 mg via INTRAVENOUS
  Filled 2019-03-07: qty 2

## 2019-03-07 MED ORDER — KETOROLAC TROMETHAMINE 30 MG/ML IJ SOLN
30.0000 mg | Freq: Once | INTRAMUSCULAR | Status: AC
  Administered 2019-03-07: 30 mg via INTRAVENOUS
  Filled 2019-03-07: qty 1

## 2019-03-07 MED ORDER — DIPHENHYDRAMINE HCL 50 MG/ML IJ SOLN
25.0000 mg | Freq: Once | INTRAMUSCULAR | Status: AC
  Administered 2019-03-07: 25 mg via INTRAVENOUS
  Filled 2019-03-07: qty 1

## 2019-03-07 MED ORDER — SUMATRIPTAN SUCCINATE 100 MG PO TABS: 100.0000 mg | ORAL_TABLET | ORAL | 0 refills | Status: DC | PRN

## 2019-03-07 MED ORDER — SODIUM CHLORIDE 0.9 % IV BOLUS
1000.0000 mL | Freq: Once | INTRAVENOUS | Status: AC
  Administered 2019-03-07: 1000 mL via INTRAVENOUS

## 2019-03-07 NOTE — Discharge Instructions (Addendum)
Thank you for allowing me to care for you today in the Emergency Department.   Follow-up with neurology if you continue to have persistent headaches, dizziness, and numbness.  You may take 1 tablet of Imitrex if you feel a headache coming on and take 1 additional tablet 2 hours later if your headache does not resolve.  Return to the emergency department if you pass out, become unable to move your neck, develop headaches with a high fever, have loss of vision in one or both eyes, have new or worsening numbness or weakness, or other new, concerning symptoms.

## 2019-03-07 NOTE — ED Notes (Signed)
The pt is c/o head pressure for onr year she has not taken anything for the head pressure.  Hx of migraines but this is not like her migraines

## 2019-03-07 NOTE — ED Provider Notes (Signed)
[facility] EMERGENCY DEPARTMENT Provider Note   CSN: [id number] Arrival date & time: 03/06/19  2308    History   Chief Complaint Chief Complaint  Patient presents with  . Headache    HPI Paula Shelton is a 38 y.o. female who presents to the emergency department with a chief complaint of headache.  The patient reports that she has been having chronic headaches since she was 17.  She reports the headaches are almost daily and are characterized as pressure-like.  She reports that the pressure will sometimes radiate up the bilateral sides of her neck into her head.  She reports a longstanding history of dizziness and generalized weakness that accompany her headaches, but reports that dizziness and weakness have worsened today.  She reports associated photophobia, which is not unusual for her headaches.  She also reports that she has been having some numbness in the fourth and fifth digits of her left foot and the fourth and fifth digits of her left hand as well as some numbness to the medial aspect of her right forearm and upper arm.  She reports that this is been present for several years.  Numbness is not worsening and is unchanged from previous today.  She reports that her main complaint is that she has been unable to walk around stores with her 2 daughters due to the dizziness and lightheadedness.  She reports dizziness lightheadedness is worse with standing, but persist with walking.  No falls, syncope, focal weakness, diplopia, blurred vision, loss of vision, neck stiffness, nausea, vomiting, abdominal pain, chest pain, shortness of breath, cough, or urinary complaints.  She reports that she has taken ibuprofen, Tylenol, and aspirin chronically for her migraines, but reports that she has not taken any medication for the last week.  She is not established with neurology.  She reports that she had an adrenal cyst removed in 2018 at [facility].      The history is provided by  the patient. No language interpreter was used.    Past Medical History:  Diagnosis Date  . Depression     There are no active problems to display for this patient.   Past Surgical History:  Procedure Laterality Date  . CHOLECYSTECTOMY    . FOOT SURGERY    . TOE SURGERY       OB History   No obstetric history on file.      Home Medications    Prior to Admission medications   Medication Sig Start Date End Date Taking? Authorizing Provider  SUMAtriptan (IMITREX) 100 MG tablet Take 1 tablet (100 mg total) by mouth every 2 (two) hours as needed for migraine. May repeat in 2 hours if headache persists or recurs. 03/07/19   ,  A, PA-C    Family History No family history on file.  Social History Social History   Tobacco Use  . Smoking status: Current Some Day Smoker    Types: Cigarettes  . Smokeless tobacco: Never Used  Substance Use Topics  . Alcohol use: No  . Drug use: No     Allergies   Keflex [cephalexin]   Review of Systems Review of Systems  Constitutional: Negative for activity change, chills and fever.  HENT: Negative for congestion, sinus pressure, sinus pain, sore throat and trouble swallowing.   Eyes: Negative for visual disturbance.  Respiratory: Negative for shortness of breath.   Cardiovascular: Negative for chest pain.  Gastrointestinal: Negative for abdominal pain, constipation, diarrhea, nausea and vomiting.  Genitourinary: Negative for dysuria, urgency, vaginal bleeding, vaginal discharge and vaginal pain.  Musculoskeletal: Positive for neck pain. Negative for back pain, myalgias and neck stiffness.  Skin: Negative for rash.  Allergic/Immunologic: Negative for immunocompromised state.  Neurological: Positive for dizziness, weakness, light-headedness, numbness and headaches. Negative for seizures, syncope and facial asymmetry.  Hematological: Does not bruise/bleed easily.  Psychiatric/Behavioral: Negative for confusion.      Physical Exam Updated Vital Signs BP 98/65 (BP Location: Left Arm)   Pulse 87   Temp 98.3 F (36.8 C) (Oral)   Resp 15   SpO2 100%   Physical Exam Vitals signs and nursing note reviewed.  Constitutional:      General: She is not in acute distress.    Appearance: She is not ill-appearing, toxic-appearing or diaphoretic.  HENT:     Head: Normocephalic.  Eyes:     Conjunctiva/sclera: Conjunctivae normal.  Neck:     Musculoskeletal: Neck supple.  Cardiovascular:     Rate and Rhythm: Normal rate and regular rhythm.     Heart sounds: No murmur. No friction rub. No gallop.   Pulmonary:     Effort: Pulmonary effort is normal. No respiratory distress.     Breath sounds: No stridor. No wheezing, rhonchi or rales.  Chest:     Chest wall: No tenderness.  Abdominal:     General: There is no distension.     Palpations: Abdomen is soft. There is no mass.     Tenderness: There is no abdominal tenderness. There is no right CVA tenderness, left CVA tenderness, guarding or rebound.     Hernia: No hernia is present.  Musculoskeletal:     Right lower leg: No edema.     Left lower leg: No edema.  Skin:    General: Skin is warm.     Findings: No rash.  Neurological:     Mental Status: She is alert and oriented to person, place, and time.     Comments: Cranial nerves II through XII are grossly intact.  Subjective decrease sensation to sharp and light touch to the fourth and fifth digits of the left hand and foot.  Finger-to-nose is intact bilaterally.  Heel-to-shin is intact bilaterally.  No clonus bilaterally.  5-5 strength against resistance of the bilateral upper and lower extremities.  Negative Romberg.  No pronator drift.  No ataxia with gait.  Psychiatric:        Behavior: Behavior normal.      ED Treatments / Results  Labs (all labs ordered are listed, but only abnormal results are displayed) Labs Reviewed  CBC - Abnormal; Notable for the following components:      Result Value    RBC 3.86 (*)    All other components within normal limits  COMPREHENSIVE METABOLIC PANEL - Abnormal; Notable for the following components:   Glucose, Bld 119 (*)    Calcium 8.8 (*)    Total Protein 6.4 (*)    Total Bilirubin 0.2 (*)    All other components within normal limits  I-STAT CHEM 8, ED - Abnormal; Notable for the following components:   Glucose, Bld 117 (*)    All other components within normal limits  PROTIME-INR  APTT  DIFFERENTIAL  I-STAT BETA HCG BLOOD, ED ([facility], [facility], [facility] ONLY)  CBG MONITORING, ED    EKG None  Radiology Ct Head Wo Contrast  Result Date: 03/07/2019 CLINICAL DATA:  Headache, positional Possible stroke EXAM: CT HEAD WITHOUT CONTRAST TECHNIQUE: Contiguous axial images were obtained  from the base of the skull through the vertex without intravenous contrast. COMPARISON:  None. FINDINGS: Brain: No intracranial hemorrhage, mass effect, or midline shift. No hydrocephalus. The basilar cisterns are patent. No evidence of territorial infarct or acute ischemia. No extra-axial or intracranial fluid collection. Vascular: No hyperdense vessel. Skull: Normal. Negative for fracture or focal lesion. Sinuses/Orbits: Paranasal sinuses and mastoid air cells are clear. The visualized orbits are unremarkable. Other: None. IMPRESSION: Negative head CT. Electronically Signed   By: Narda RutherfordMelanie  Sanford M.D.   On: 03/07/2019 01:41    Procedures Procedures (including critical care time)  Medications Ordered in ED Medications  sodium chloride flush (NS) 0.9 % injection 3 mL (0 mLs Intravenous Hold 03/07/19 0706)  ketorolac (TORADOL) 30 MG/ML injection 30 mg (30 mg Intravenous Given 03/07/19 [id number])  sodium chloride 0.9 % bolus 1,000 mL (0 mLs Intravenous Stopped 03/07/19 0800)  prochlorperazine (COMPAZINE) injection 10 mg (10 mg Intravenous Given 03/07/19 0605)  diphenhydrAMINE (BENADRYL) injection 25 mg (25 mg Intravenous Given 03/07/19 0559)     Initial Impression / Assessment and Plan  / ED Course  I have reviewed the triage vital signs and the nursing notes.  Pertinent labs & imaging results that were available during my care of the patient were reviewed by me and considered in my medical decision making (see chart for details).        38 year old female with a longstanding history of headaches, dizziness lightheadedness, numbness to the fourth and fifth digits of the left hand and foot for many years presents with worsening dizziness and lightheadedness today.  She has taken OTC medications for many years for her headaches, but none for the last week.  She has decreased subjective sensation to sharp and light touch to the fourth and fifth digits of the left hand and foot, but she is otherwise neurologically intact.  Although the patient may warrant neurology follow-up and possible outpatient MRI, I do not feel that an emergent MRI is indicated in the ER.  CT head and labs were ordered by triage staff.  Not certain reassuring.  CT head is unremarkable.  Prior to migraine cocktail, the patient was ambulated to the restroom and endorsed dizziness.  Following migraine cocktail and fluids, I personally ambulated the patient through the department she reports dizziness have resolved.  Headache was significantly improved and she is feeling much better and ready for discharge.  We will provide the patient with an outpatient neurology referral given her history of chronic headaches.  Of note, the patient does not have a familial history of MS or other autoimmune diseases.  She is hemodynamically stable and in no acute distress.  Discharged with a short course of Imitrex and recommended outpatient follow-up.  ER return cautions given.    Final Clinical Impressions(s) / ED Diagnoses   Final diagnoses:  Bad headache    ED Discharge Orders         Ordered    SUMAtriptan (IMITREX) 100 MG tablet  Every 2 hours PRN     03/07/19 0723           Frederik PearMcDonald,  A, PA-C 03/07/19 [id number]     Nira Connardama, Pedro Eduardo, MD 03/08/19 2020

## 2019-03-07 NOTE — ED Notes (Signed)
Pt endorses PA from previous shift ambulated with her. Pt states orthostatic VS were completed but this NT unable to locate in chart. [place], [place] notified.

## 2019-03-26 ENCOUNTER — Telehealth: Payer: Self-pay | Admitting: Family Medicine

## 2019-03-26 IMAGING — CR DG CHEST 2V
1 series · 2 of 2 positions shown · non-contrast
Comparison: Prior radiograph from 11/14/2013.

CLINICAL DATA: Initial evaluation for acute left-sided chest pain
since [REDACTED].

EXAM:
CHEST  2 VIEW

[Series 1: dg chest 2 view · 0.14mm/px · 2 of 2 slices shown]
[im 1/2]
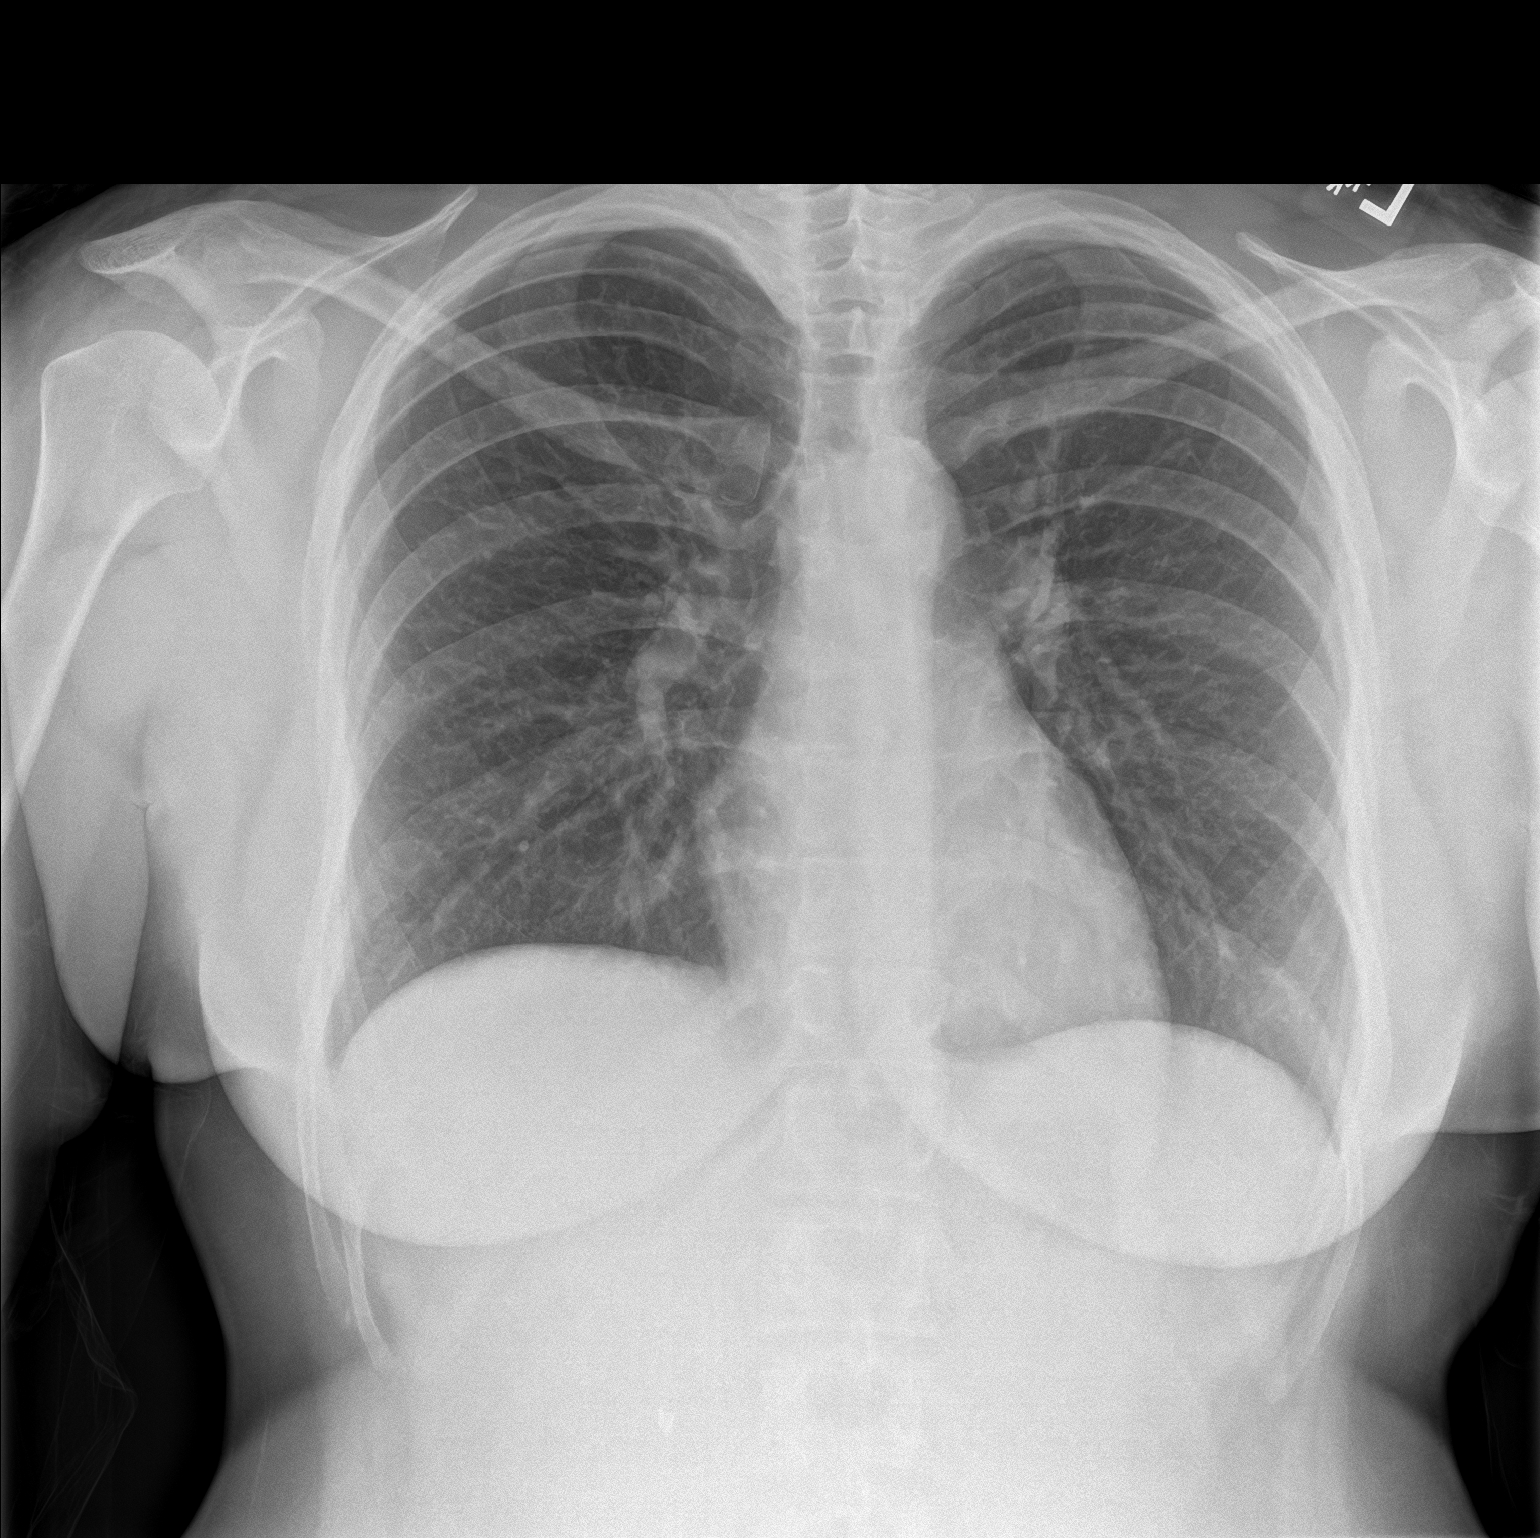
[im 2/2]
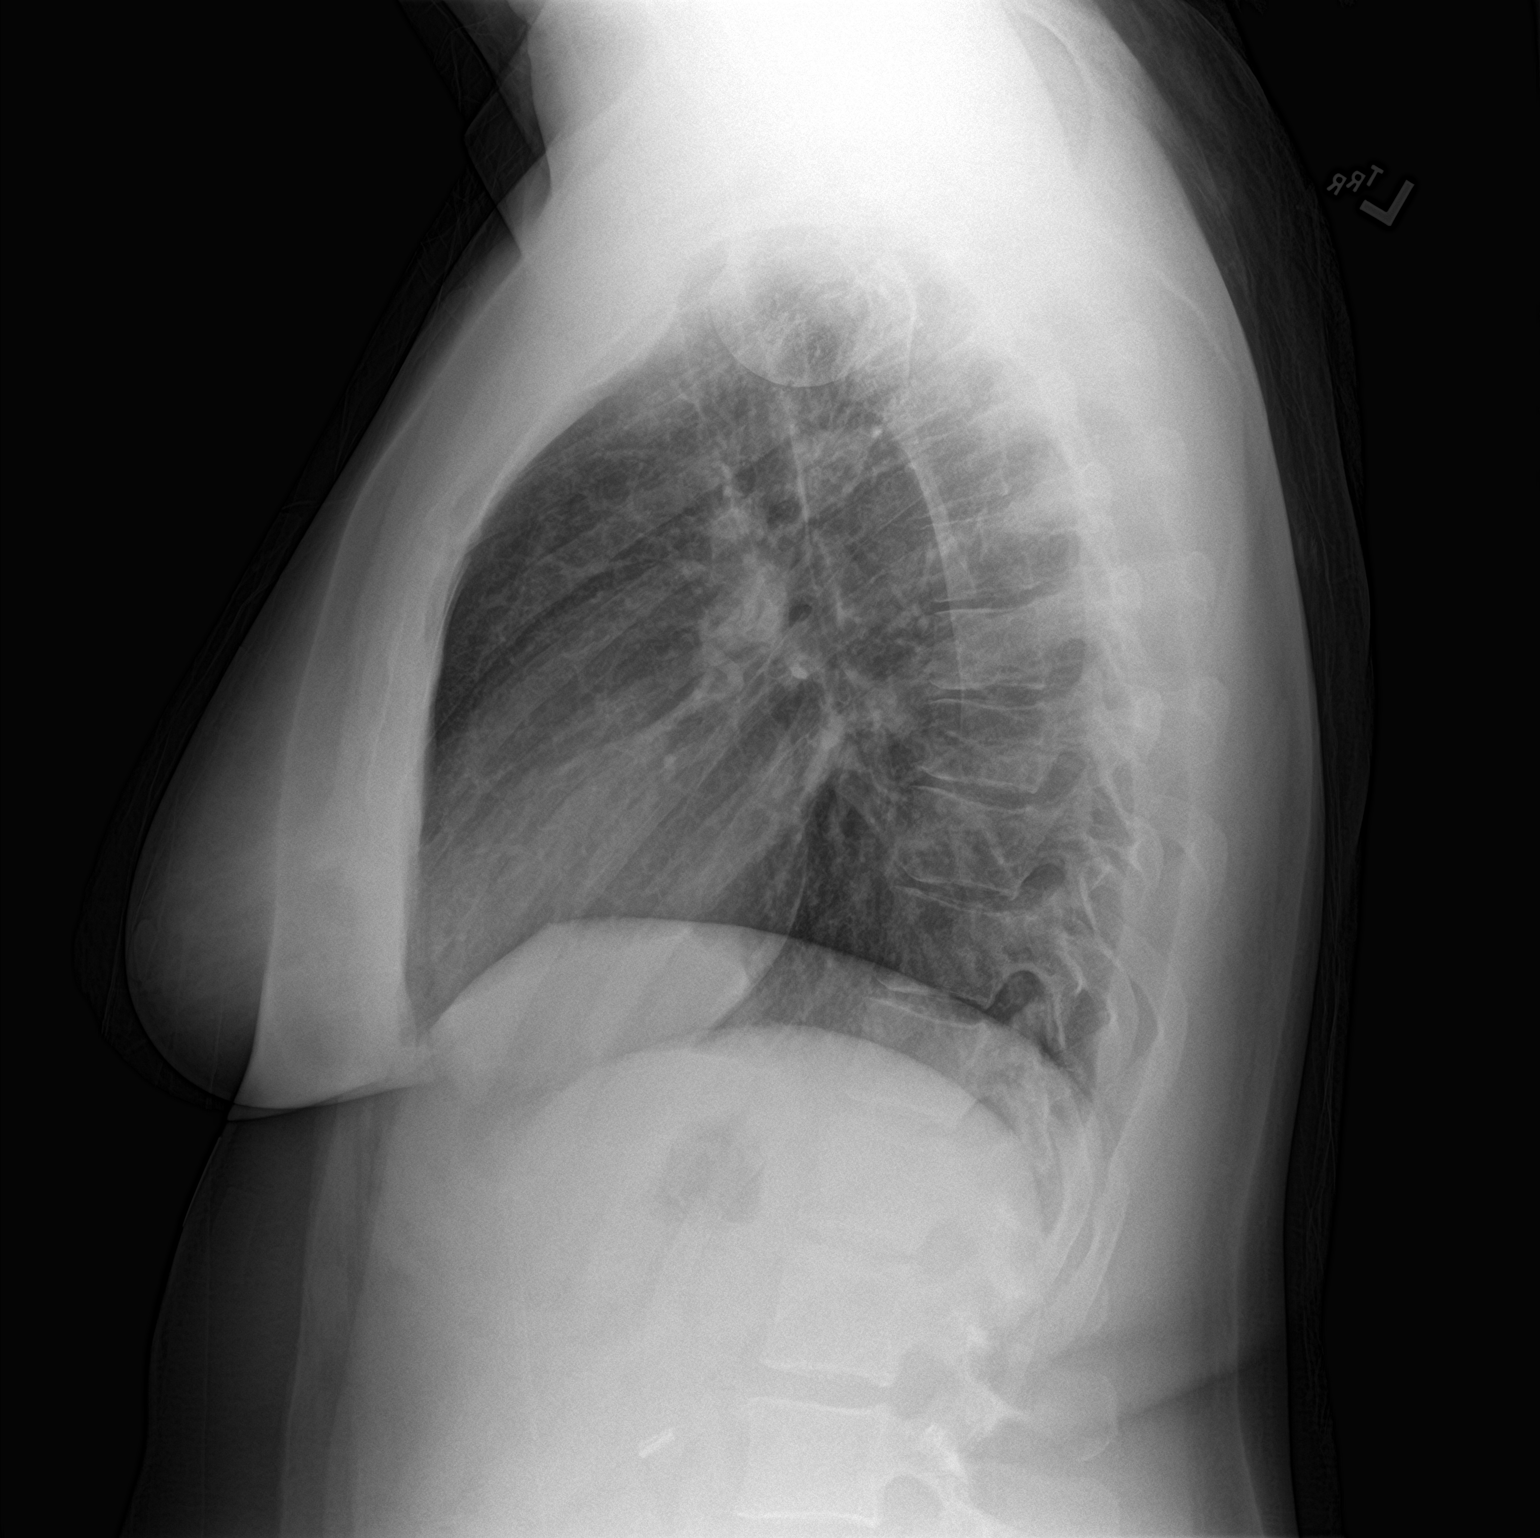

[2 of 2 positions shown; findings below may reference images not displayed]

FINDINGS: The cardiac and mediastinal silhouettes are stable in size and
contour, and remain within normal limits.

The lungs are normally inflated. No airspace consolidation, pleural
effusion, or pulmonary edema is identified. There is no
pneumothorax.

No acute osseous abnormality identified.
IMPRESSION: No active cardiopulmonary disease.

## 2019-03-26 NOTE — Telephone Encounter (Signed)
Returned patient phone call. Patient requesting to schedule Nexplanon removal appt. Patient states she had current Nexplanon device inserted here in 2018. RN identified patient and DOB regarding per [facility] EMR system, patient kept a scheduled appt here at this facility 09/20/2017 for a Nexplanon removal. Patient adamant that she has not had her current Nexplanon device removed since insertion date of 05/09/2017. Patient states "I have all of my blue cards telling me when I had them put in." Patient questioning RN "do you have me mixed up with my sister because she comes there too?" RN again confirmed patient's name and DOB. Patient adamant that she wants her 2018 Nexplanon removed. Patient counseled on recent sex and risk of becoming pregnant if she has had sex in the past seven days. Patient states she has not had sex "in months." Patient scheduled for Nexplanon removal for 04/01/2019 @ 2:00. Instructed patient to arrive at 1:40 for check in. Patient verbalized understanding of above. Hal Morales, RN

## 2019-04-01 ENCOUNTER — Other Ambulatory Visit: Payer: Self-pay

## 2019-04-01 ENCOUNTER — Encounter: Payer: Self-pay | Admitting: Nurse Practitioner

## 2019-04-01 ENCOUNTER — Ambulatory Visit (LOCAL_COMMUNITY_HEALTH_CENTER): Payer: Medicaid Other | Admitting: Nurse Practitioner

## 2019-04-01 VITALS — BP 95/58 | Ht 68.5 in | Wt 247.6 lb

## 2019-04-01 DIAGNOSIS — Z3009 Encounter for other general counseling and advice on contraception: Secondary | ICD-10-CM

## 2019-04-01 DIAGNOSIS — F319 Bipolar disorder, unspecified: Secondary | ICD-10-CM | POA: Insufficient documentation

## 2019-04-01 DIAGNOSIS — Z3046 Encounter for surveillance of implantable subdermal contraceptive: Secondary | ICD-10-CM | POA: Diagnosis not present

## 2019-04-01 MED ORDER — ETONOGESTREL-ETHINYL ESTRADIOL 0.12-0.015 MG/24HR VA RING: VAGINAL_RING | VAGINAL | 3 refills | Status: DC

## 2019-04-01 NOTE — Patient Instructions (Signed)
Ethinyl Estradiol; Etonogestrel vaginal ring What is this medicine? ETHINYL ESTRADIOL; ETONOGESTREL (ETH in il es tra DYE ole; et oh noe JES trel) vaginal ring is a flexible, vaginal ring used as a contraceptive (birth control method). This medicine combines 2 types of female hormones, an estrogen and a progestin. This ring is used to prevent ovulation and pregnancy. Each ring is effective for 1 month. This medicine may be used for other purposes; ask your health care provider or pharmacist if you have questions. COMMON BRAND NAME(S): EluRyng, NuvaRing What should I tell my health care provider before I take this medicine? They need to know if you have any of these conditions:  abnormal vaginal bleeding  blood vessel disease or blood clots  breast, cervical, endometrial, ovarian, liver, or uterine cancer  diabetes  gallbladder disease  having surgery  heart disease or recent heart attack  high blood pressure  high cholesterol or triglycerides  history of irregular heartbeat or heart valve problems  kidney disease  liver disease  migraine headaches  protein C deficiency  protein S deficiency  recently had a baby, miscarriage, or abortion  stroke  systemic lupus erythematosus (SLE)  tobacco smoker  your age is more than 35 years old  an unusual or allergic reaction to estrogens, progestins, other medicines, foods, dyes, or preservatives  pregnant or trying to get pregnant  breast-feeding How should I use this medicine? Insert the ring into your vagina as directed. Follow the directions on the prescription label. The ring will remain place for 3 weeks and is then removed for a 1-week break. A new ring is inserted 1 week after the last ring was removed, on the same day of the week. Check often to make sure the ring is still in place. If the ring was out of the vagina for an unknown amount of time, you may not be protected from pregnancy. Perform a pregnancy test and  call your doctor. Do not use more often than directed. A patient package insert for the product will be given with each prescription and refill. Read this sheet carefully each time. The sheet may change frequently. Contact your pediatrician regarding the use of this medicine in children. Special care may be needed. Overdosage: If you think you have taken too much of this medicine contact a poison control center or emergency room at once. NOTE: This medicine is only for you. Do not share this medicine with others. What if I miss a dose? You will need to use the ring exactly as directed. It is very important to follow the schedule every cycle. If you do not use the ring as directed, you may not be protected from pregnancy. If the ring should slip out, is lost, or if you leave it in longer or shorter than you should, contact your health care professional for advice. What may interact with this medicine? Do not take this medicine with the following medications:  dasabuvir; ombitasvir; paritaprevir; ritonavir  ombitasvir; paritaprevir; ritonavir  vaginal lubricants or other vaginal products that are oil-based or silicone-based This medicine may also interact with the following medications:  acetaminophen  antibiotics or medicines for infections, especially rifampin, rifabutin, rifapentine, and griseofulvin, and possibly penicillins or tetracyclines  aprepitant or fosaprepitant  armodafinil  ascorbic acid (vitamin C)  barbiturate medicines, such as phenobarbital or primidone  bosentan  certain antiviral medicines for hepatitis, HIV or AIDS  certain medicines for cancer treatment  certain medicines for seizures like carbamazepine, clobazam, felbamate, lamotrigine, oxcarbazepine, phenytoin,   rufinamide, topiramate  certain medicines for treating high cholesterol  cyclosporine  dantrolene  elagolix  flibanserin  grapefruit juice  lesinurad  medicines for diabetes  medicines  to treat fungal infections, such as griseofulvin, miconazole, fluconazole, ketoconazole, itraconazole, posaconazole or voriconazole  mifepristone  mitotane  modafinil  morphine  mycophenolate  St. John's wort  tamoxifen  temazepam  theophylline or aminophylline  thyroid hormones  tizanidine  tranexamic acid  ulipristal  warfarin This list may not describe all possible interactions. Give your health care provider a list of all the medicines, herbs, non-prescription drugs, or dietary supplements you use. Also tell them if you smoke, drink alcohol, or use illegal drugs. Some items may interact with your medicine. What should I watch for while using this medicine? Visit your doctor or health care professional for regular checks on your progress. You will need a regular breast and pelvic exam and Pap smear while on this medicine. Check with your doctor or health care professional to see if you need an additional method of contraception during the first cycle that you use this ring. Female condoms (made with natural rubber latex, polyisoprene, and polyurethane) and spermicides may be used. Do not use a diaphragm, cervical cap, or a female condom, as the ring can interfere with these birth control methods and their proper placement. If you have any reason to think you are pregnant, stop using this medicine right away and contact your doctor or health care professional. If you are using this medicine for hormone related problems, it may take several cycles of use to see improvement in your condition. Smoking increases the risk of getting a blood clot or having a stroke while you are using hormonal birth control, especially if you are more than 35 years old. You are strongly advised not to smoke. Some women are prone to getting dark patches on the skin of the face (cholasma). Your risk of getting chloasma with this medicine is higher if you had chloasma during a pregnancy. Keep out of the  sun. If you cannot avoid being in the sun, wear protective clothing and use sunscreen. Do not use sun lamps or tanning beds/booths. This medicine can make your body retain fluid, making your fingers, hands, or ankles swell. Your blood pressure can go up. Contact your doctor or health care professional if you feel you are retaining fluid. If you are going to have elective surgery, you may need to stop using this medicine before the surgery. Consult your health care professional for advice. This medicine does not protect you against HIV infection (AIDS) or any other sexually transmitted diseases. What side effects may I notice from receiving this medicine? Side effects that you should report to your doctor or health care professional as soon as possible:  allergic reactions such as skin rash or itching, hives, swelling of the lips, mouth, tongue, or throat  depression  high blood pressure  migraines or severe, sudden headaches  signs and symptoms of a blood clot such as breathing problems; changes in vision; chest pain; severe, sudden headache; pain, swelling, warmth in the leg; trouble speaking; sudden numbness or weakness of the face, arm or leg  signs and symptoms of infection like fever or chills with dizziness and a sunburn-like rash, or pain or trouble passing urine  stomach pain  symptoms of vaginal infection like itching, irritation or unusual discharge  yellowing of the eyes or skin Side effects that usually do not require medical attention (report these to your doctor   or health care professional if they continue or are bothersome):  acne  breast pain, tenderness  irregular vaginal bleeding or spotting, particularly during the first month of use  mild headache  nausea  painful periods  vomiting This list may not describe all possible side effects. Call your doctor for medical advice about side effects. You may report side effects to FDA at 1-800-FDA-1088. Where should I  keep my medicine? Keep out of the reach of children. Store unopened rings in the original foil pouch at room temperature between 20 and 25 degrees C (68 and 77 degrees F) for up to 4 months. Protect from light. Do not store above 30 degrees C (86 degrees F). Throw away any unused medicine after the expiration date. A ring may only be used for 1 cycle (1 month). After the 3-week cycle, a used ring is removed and should be placed in the re-closable foil pouch and discarded in the trash out of reach of children and pets. Do NOT flush down the toilet. NOTE: This sheet is a summary. It may not cover all possible information. If you have questions about this medicine, talk to your doctor, pharmacist, or health care provider.  2020 Elsevier/Gold Standard (2017-03-31 14:41:10)  

## 2019-04-01 NOTE — Progress Notes (Signed)
Here today for Nexplanon removal. Would like to discuss alternate birth control options. Hal Morales, RN

## 2019-04-01 NOTE — Progress Notes (Signed)
[facility] problem visit - client desires Nexplanon removal and wants to start [employer]euvaring  [facility]  Subjective:  Paula Shelton Salman is a 38 y.o. being seen today for   Chief Complaint  Patient presents with  . Procedure    Nexplanon Removal  . 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 No   No components found for: HCV]   Health Maintenance Due  Topic Date Due  . TETANUS/TDAP  06/22/2002  . PAP SMEAR-Modifier  10/29/2016  . INFLUENZA VACCINE  03/16/2019    Review of Systems  Constitutional: Negative.   Eyes: Negative.   Respiratory: Negative.   Cardiovascular: Negative.   Skin: Negative.   Neurological: Positive for headaches (hx of migraines).  Psychiatric/Behavioral:       Hx of bipolar and PTSD  All other systems reviewed and are negative.   The following portions of the patient's history were reviewed and updated as appropriate: allergies, current medications, past family history, past medical history, past social history, past surgical history and problem list. Problem list updated.   See flowsheet for other program required questions.  Objective:   Vitals:   04/01/19 1415  BP: (!) 95/58  Weight: 247 lb 9.6 oz (112.3 kg)  Height: 5' 8.5" (1.74 m)    Physical Exam Vitals signs reviewed.  Constitutional:      Appearance: Normal appearance. She is well-developed and normal weight.  Pulmonary:     Effort: Pulmonary effort is normal.  Skin:    General: Skin is warm and dry.  Neurological:     Mental Status: She is alert.  Psychiatric:        Behavior: Behavior is cooperative.       Assessment and Plan:  Paula Shelton Alcott is a 38 y.o. female presenting to the [facility] for a Women's Health problem visit  1. Encounter for Nexplanon removal Nexplanon Removal  Unable to remove  device Dr. Alvester MorinNewton - assisted and removed after several minutes  Patient identified, informed consent performed, consent signed.   Appropriate time out taken. Nexplanon site identified.  Area prepped in usual sterile fashon. 3 ml of 1% lidocaine with Epinephrine was used to anesthetize the area at the distal end of the implant and along implant site. A small stab incision was made right beside the implant on the distal portion.  The Nexplanon rod was grasped using both curved and straight hemostats and removed after several minutes.  There was minimal blood loss. There were no complications.  Steri-strips were applied over the small incision.  A pressure bandage was applied to reduce any bruising.  The patient tolerated the procedure well and was given post procedure instructions.    Nexplanon:   Counseled patient to take OTC analgesic starting as soon as lidocaine starts to wear off and take regularly for at least 48 hr to decrease discomfort.  Specifically to take with food or milk to decrease stomach upset and for IB 600 mg (3 tablets) every 6 hrs; IB 800 mg (4 tablets) every 8 hrs; or Aleve 2 tablets every 12 hrs.     2. Family planning services Discussed how to properly use Nuvaring  - etonogestrel-ethinyl estradiol (NUVARING) 0.12-0.015 MG/24HR vaginal ring; Insert vaginally and leave in place for 3 consecutive weeks, then remove for 1 week.  Dispense:  1 each; Refill: 3  Advised client to RTC in 3 months to re evaluate Nuvaring use and possible continuation.  Client verbalizes understanding and is in agreement with plan of care   Return in about 3 months (around 07/02/2019) for Yearly physical exam, continuation of Nuvaring.  Future Appointments  Date Time Provider [place]  04/30/2019  9:00 AM Melvenia Beam, MD [facility] None    Berniece Andreas, NP

## 2019-04-24 ENCOUNTER — Telehealth: Payer: Self-pay | Admitting: *Deleted

## 2019-04-24 NOTE — Telephone Encounter (Signed)
Spoke with pt to r/s her appt since MD out 9/15. Pt stated she meant to call. She doesn't think appt is needed at this time. Stated she had an Implanon and was having headaches and arm pain. She stated she finally had it removed on 8/17 and she hasn't had a headache since. I encouraged pt to please call back to r/s if she changes her mind and needs the appointment. She verbalized appreciation.   9/15 appt canceled.

## 2019-04-30 ENCOUNTER — Ambulatory Visit: Payer: Medicaid Other | Admitting: Neurology

## 2019-05-03 ENCOUNTER — Ambulatory Visit (LOCAL_COMMUNITY_HEALTH_CENTER): Payer: Self-pay

## 2019-05-03 ENCOUNTER — Other Ambulatory Visit: Payer: Self-pay

## 2019-05-03 DIAGNOSIS — Z111 Encounter for screening for respiratory tuberculosis: Secondary | ICD-10-CM

## 2019-05-06 ENCOUNTER — Other Ambulatory Visit: Payer: Self-pay

## 2019-05-06 ENCOUNTER — Ambulatory Visit (LOCAL_COMMUNITY_HEALTH_CENTER): Payer: Medicaid Other

## 2019-05-06 DIAGNOSIS — Z111 Encounter for screening for respiratory tuberculosis: Secondary | ICD-10-CM

## 2019-05-06 LAB — TB SKIN TEST
Induration: 0 mm
TB Skin Test: NEGATIVE

## 2019-05-30 ENCOUNTER — Other Ambulatory Visit: Payer: Self-pay

## 2019-05-30 DIAGNOSIS — Z20822 Contact with and (suspected) exposure to covid-19: Secondary | ICD-10-CM

## 2019-06-01 LAB — NOVEL CORONAVIRUS, NAA: SARS-CoV-2, NAA: NOT DETECTED

## 2019-06-06 ENCOUNTER — Other Ambulatory Visit: Payer: Self-pay

## 2019-06-06 DIAGNOSIS — Z20822 Contact with and (suspected) exposure to covid-19: Secondary | ICD-10-CM

## 2019-06-09 LAB — NOVEL CORONAVIRUS, NAA: SARS-CoV-2, NAA: NOT DETECTED

## 2019-06-28 ENCOUNTER — Other Ambulatory Visit: Payer: Self-pay

## 2019-06-28 DIAGNOSIS — Z20822 Contact with and (suspected) exposure to covid-19: Secondary | ICD-10-CM

## 2019-06-30 LAB — NOVEL CORONAVIRUS, NAA: SARS-CoV-2, NAA: NOT DETECTED

## 2019-07-09 ENCOUNTER — Other Ambulatory Visit: Payer: Self-pay

## 2019-07-09 DIAGNOSIS — Z20822 Contact with and (suspected) exposure to covid-19: Secondary | ICD-10-CM

## 2019-07-10 LAB — NOVEL CORONAVIRUS, NAA: SARS-CoV-2, NAA: DETECTED — AB

## 2019-07-14 ENCOUNTER — Emergency Department (HOSPITAL_COMMUNITY): Payer: Medicaid Other

## 2019-07-14 ENCOUNTER — Other Ambulatory Visit: Payer: Self-pay

## 2019-07-14 ENCOUNTER — Encounter (HOSPITAL_COMMUNITY): Payer: Self-pay | Admitting: Emergency Medicine

## 2019-07-14 DIAGNOSIS — U071 COVID-19: Secondary | ICD-10-CM | POA: Diagnosis not present

## 2019-07-14 DIAGNOSIS — R0602 Shortness of breath: Secondary | ICD-10-CM | POA: Insufficient documentation

## 2019-07-14 DIAGNOSIS — R079 Chest pain, unspecified: Secondary | ICD-10-CM | POA: Insufficient documentation

## 2019-07-14 DIAGNOSIS — Z79899 Other long term (current) drug therapy: Secondary | ICD-10-CM | POA: Insufficient documentation

## 2019-07-14 DIAGNOSIS — F17210 Nicotine dependence, cigarettes, uncomplicated: Secondary | ICD-10-CM | POA: Insufficient documentation

## 2019-07-14 NOTE — ED Triage Notes (Addendum)
Patient here from home with complaints of chest pain and sob x3 days ago. COVID + 11/24. 100%RA

## 2019-07-15 ENCOUNTER — Encounter (HOSPITAL_COMMUNITY): Payer: Self-pay

## 2019-07-15 ENCOUNTER — Emergency Department (HOSPITAL_COMMUNITY)
Admission: EM | Admit: 2019-07-15 | Discharge: 2019-07-15 | Disposition: A | Discharge status: Home / Self Care | Payer: Medicaid Other | Attending: Emergency Medicine | Admitting: Emergency Medicine

## 2019-07-15 ENCOUNTER — Emergency Department (HOSPITAL_COMMUNITY): Payer: Medicaid Other

## 2019-07-15 DIAGNOSIS — U071 COVID-19: Secondary | ICD-10-CM

## 2019-07-15 LAB — I-STAT CHEM 8, ED
BUN: 10 mg/dL (ref 6–20)
Calcium, Ion: 1.19 mmol/L (ref 1.15–1.40)
Chloride: 104 mmol/L (ref 98–111)
Creatinine, Ser: 0.8 mg/dL (ref 0.44–1.00)
Glucose, Bld: 125 mg/dL — ABNORMAL HIGH (ref 70–99)
HCT: 41 % (ref 36.0–46.0)
Hemoglobin: 13.9 g/dL (ref 12.0–15.0)
Potassium: 3.9 mmol/L (ref 3.5–5.1)
Sodium: 140 mmol/L (ref 135–145)
TCO2: 24 mmol/L (ref 22–32)

## 2019-07-15 LAB — CBC WITH DIFFERENTIAL/PLATELET
Abs Immature Granulocytes: 0.03 10*3/uL (ref 0.00–0.07)
Basophils Absolute: 0 10*3/uL (ref 0.0–0.1)
Basophils Relative: 0 %
Eosinophils Absolute: 0.2 10*3/uL (ref 0.0–0.5)
Eosinophils Relative: 2 %
HCT: 41.4 % (ref 36.0–46.0)
Hemoglobin: 13.1 g/dL (ref 12.0–15.0)
Immature Granulocytes: 0 %
Lymphocytes Relative: 31 %
Lymphs Abs: 2.5 10*3/uL (ref 0.7–4.0)
MCH: 29.9 pg (ref 26.0–34.0)
MCHC: 31.6 g/dL (ref 30.0–36.0)
MCV: 94.5 fL (ref 80.0–100.0)
Monocytes Absolute: 0.7 10*3/uL (ref 0.1–1.0)
Monocytes Relative: 9 %
Neutro Abs: 4.6 10*3/uL (ref 1.7–7.7)
Neutrophils Relative %: 58 %
Platelets: 259 10*3/uL (ref 150–400)
RBC: 4.38 MIL/uL (ref 3.87–5.11)
RDW: 12 % (ref 11.5–15.5)
WBC: 8 10*3/uL (ref 4.0–10.5)
nRBC: 0 % (ref 0.0–0.2)

## 2019-07-15 LAB — TROPONIN I (HIGH SENSITIVITY)
Troponin I (High Sensitivity): <2 ng/L (ref <18)
Troponin I (High Sensitivity): <2 ng/L (ref <18)

## 2019-07-15 LAB — I-STAT BETA HCG BLOOD, ED (MC, WL, AP ONLY): I-stat hCG, quantitative: <5 m[IU]/mL (ref <5)

## 2019-07-15 LAB — D-DIMER, QUANTITATIVE: D-Dimer, Quant: 0.68 ug/mL-FEU — ABNORMAL HIGH (ref 0.00–0.50)

## 2019-07-15 MED ORDER — ACETAMINOPHEN 500 MG PO TABS
1000.0000 mg | ORAL_TABLET | Freq: Once | ORAL | Status: AC
  Administered 2019-07-15: 1000 mg via ORAL

## 2019-07-15 MED ORDER — IOHEXOL 350 MG/ML SOLN
100.0000 mL | Freq: Once | INTRAVENOUS | Status: AC | PRN
  Administered 2019-07-15: 100 mL via INTRAVENOUS

## 2019-07-15 MED ORDER — ACETAMINOPHEN 500 MG PO TABS
ORAL_TABLET | ORAL | Status: AC
  Filled 2019-07-15: qty 2

## 2019-07-15 NOTE — Discharge Instructions (Signed)
Person Under Monitoring Name: Paula Shelton  Location: [address] [place] [address]   Infection Prevention Recommendations for Individuals Confirmed to have, or Being Evaluated for, 2019 Novel Coronavirus (COVID-19) Infection Who Receive Care at Home  Individuals who are confirmed to have, or are being evaluated for, COVID-19 should follow the prevention steps below until a healthcare provider or local or state health department says they can return to normal activities.  Stay home except to get medical care You should restrict activities outside your home, except for getting medical care. Do not go to work, school, or public areas, and do not use public transportation or taxis.  Call ahead before visiting your doctor Before your medical appointment, call the healthcare provider and tell them that you have, or are being evaluated for, COVID-19 infection. This will help the healthcare providers office take steps to keep other people from getting infected. Ask your healthcare provider to call the local or state health department.  Monitor your symptoms Seek prompt medical attention if your illness is worsening (e.g., difficulty breathing). Before going to your medical appointment, call the healthcare provider and tell them that you have, or are being evaluated for, COVID-19 infection. Ask your healthcare provider to call the local or state health department.  Wear a facemask You should wear a facemask that covers your nose and mouth when you are in the same room with other people and when you visit a healthcare provider. People who live with or visit you should also wear a facemask while they are in the same room with you.  Separate yourself from other people in your home As much as possible, you should stay in a different room from other people in your home. Also, you should use a separate bathroom, if available.  Avoid sharing household items You should not  share dishes, drinking glasses, cups, eating utensils, towels, bedding, or other items with other people in your home. After using these items, you should wash them thoroughly with soap and water.  Cover your coughs and sneezes Cover your mouth and nose with a tissue when you cough or sneeze, or you can cough or sneeze into your sleeve. Throw used tissues in a lined trash can, and immediately wash your hands with soap and water for at least 20 seconds or use an alcohol-based hand rub.  Wash your [facility] your hands often and thoroughly with soap and water for at least 20 seconds. You can use an alcohol-based hand sanitizer if soap and water are not available and if your hands are not visibly dirty. Avoid touching your eyes, nose, and mouth with unwashed hands.   Prevention Steps for Caregivers and Household Members of Individuals Confirmed to have, or Being Evaluated for, COVID-19 Infection Being Cared for in the Home  If you live with, or provide care at home for, a person confirmed to have, or being evaluated for, COVID-19 infection please follow these guidelines to prevent infection:  Follow healthcare providers instructions Make sure that you understand and can help the patient follow any healthcare provider instructions for all care.  Provide for the patients basic needs You should help the patient with basic needs in the home and provide support for getting groceries, prescriptions, and other personal needs.  Monitor the patients symptoms If they are getting sicker, call his or her medical provider and tell them that the patient has, or is being evaluated for, COVID-19 infection. This will help the  healthcare providers office take steps to keep other people from getting infected. Ask the healthcare provider to call the local or state health department.  Limit the number of people who have contact with the patient If possible, have only one caregiver for the  patient. Other household members should stay in another home or place of residence. If this is not possible, they should stay in another room, or be separated from the patient as much as possible. Use a separate bathroom, if available. Restrict visitors who do not have an essential need to be in the home.  Keep older adults, very young children, and other sick people away from the patient Keep older adults, very young children, and those who have compromised immune systems or chronic health conditions away from the patient. This includes people with chronic heart, lung, or kidney conditions, diabetes, and cancer.  Ensure good ventilation Make sure that shared spaces in the home have good air flow, such as from an air conditioner or an opened window, weather permitting.  Wash your hands often Wash your hands often and thoroughly with soap and water for at least 20 seconds. You can use an alcohol based hand sanitizer if soap and water are not available and if your hands are not visibly dirty. Avoid touching your eyes, nose, and mouth with unwashed hands. Use disposable paper towels to dry your hands. If not available, use dedicated cloth towels and replace them when they become wet.  Wear a facemask and gloves Wear a disposable facemask at all times in the room and gloves when you touch or have contact with the patients blood, body fluids, and/or secretions or excretions, such as sweat, saliva, sputum, nasal mucus, vomit, urine, or feces.  Ensure the mask fits over your nose and mouth tightly, and do not touch it during use. Throw out disposable facemasks and gloves after using them. Do not reuse. Wash your hands immediately after removing your facemask and gloves. If your personal clothing becomes contaminated, carefully remove clothing and launder. Wash your hands after handling contaminated clothing. Place all used disposable facemasks, gloves, and other waste in a lined container before  disposing them with other household waste. Remove gloves and wash your hands immediately after handling these items.  Do not share dishes, glasses, or other household items with the patient Avoid sharing household items. You should not share dishes, drinking glasses, cups, eating utensils, towels, bedding, or other items with a patient who is confirmed to have, or being evaluated for, COVID-19 infection. After the person uses these items, you should wash them thoroughly with soap and water.  Wash laundry thoroughly Immediately remove and wash clothes or bedding that have blood, body fluids, and/or secretions or excretions, such as sweat, saliva, sputum, nasal mucus, vomit, urine, or feces, on them. Wear gloves when handling laundry from the patient. Read and follow directions on labels of laundry or clothing items and detergent. In general, wash and dry with the warmest temperatures recommended on the label.  Clean all areas the individual has used often Clean all touchable surfaces, such as counters, tabletops, doorknobs, bathroom fixtures, toilets, phones, keyboards, tablets, and bedside tables, every day. Also, clean any surfaces that may have blood, body fluids, and/or secretions or excretions on them. Wear gloves when cleaning surfaces the patient has come in contact with. Use a diluted bleach solution (e.g., dilute bleach with 1 part bleach and 10 parts water) or a household disinfectant with a label that says EPA-registered for coronaviruses. To  make a bleach solution at home, add 1 tablespoon of bleach to 1 quart (4 cups) of water. For a larger supply, add  cup of bleach to 1 gallon (16 cups) of water. Read labels of cleaning products and follow recommendations provided on product labels. Labels contain instructions for safe and effective use of the cleaning product including precautions you should take when applying the product, such as wearing gloves or eye protection and making sure you  have good ventilation during use of the product. Remove gloves and wash hands immediately after cleaning.  Monitor yourself for signs and symptoms of illness Caregivers and household members are considered close contacts, should monitor their health, and will be asked to limit movement outside of the home to the extent possible. Follow the monitoring steps for close contacts listed on the symptom monitoring form.   ? If you have additional questions, contact your local health department or call the epidemiologist on call at [phone] (available 24/7). ? This guidance is subject to change. For the most up-to-date guidance from [facility], please refer to their website: [email or web address]

## 2019-07-15 NOTE — ED Provider Notes (Addendum)
[facility] DEPT Provider Note   CSN: [id number] Arrival date & time: 07/14/19  2116     History   Chief Complaint Chief Complaint  Patient presents with  . Chest Pain  . Shortness of Breath    HPI Paula Shelton is a 38 y.o. female.     The history is provided by the patient.  Chest Pain Pain location:  Substernal area Pain quality: dull   Pain radiates to:  Does not radiate Pain severity:  Moderate Onset quality:  Gradual Duration:  1 day Timing:  Constant Progression:  Unchanged Chronicity:  New Context: not breathing   Relieved by:  Nothing Worsened by:  Nothing Ineffective treatments:  None tried Associated symptoms: shortness of breath   Associated symptoms: no abdominal pain, no fever, no palpitations and no weakness   Risk factors: no aortic disease   Shortness of Breath Severity:  Moderate Onset quality:  Gradual Duration:  3 days Timing:  Constant Progression:  Unchanged Chronicity:  New Context: URI   Context comment:  Since testing positive for covid Relieved by:  Nothing Worsened by:  Nothing Ineffective treatments:  None tried Associated symptoms: chest pain   Associated symptoms: no abdominal pain and no fever   Patient with covid and chest pain and SOB x 3 days.  No anosmia. No diarrhea.  No f/c/r.  Patient has not taken tylenol or anything for her symptoms.    Past Medical History:  Diagnosis Date  . Depression     Patient Active Problem List   Diagnosis Date Noted  . Bipolar 1 disorder ([place]) 04/01/2019  . History of posttraumatic stress disorder (PTSD) 05/16/2017  . Mixed anxiety and depressive disorder 05/16/2017  . 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 History of major depression 01/21/2017    Past Surgical History:  Procedure Laterality Date  . CHOLECYSTECTOMY    . FOOT SURGERY    . TOE SURGERY       OB History   No obstetric history on file.      Home Medications     Prior to Admission medications   Medication Sig Start Date End Date Taking? Authorizing Provider  etonogestrel-ethinyl estradiol (NUVARING) 0.12-0.015 MG/24HR vaginal ring Insert vaginally and leave in place for 3 consecutive weeks, then remove for 1 week. 04/01/19   Willaim Sheng, NP  norethindrone (MICRONOR) 0.35 MG tablet Take 1 tablet by mouth daily.    [provider]  SUMAtriptan (IMITREX) 100 MG tablet Take 1 tablet (100 mg total) by mouth every 2 (two) hours as needed for migraine. May repeat in 2 hours if headache persists or recurs. 03/07/19   McDonald, Mia A, PA-C    Family History Family History  Problem Relation Age of Onset  . Breast cancer Mother   . Depression Mother   . Diabetes Mother   . Hypertension Mother   . Migraines Mother   . Hypertension Father   . Migraines Father     Social History Social History   Tobacco Use  . Smoking status: Current Some Day Smoker    Types: Cigarettes  . Smokeless tobacco: Never Used  Substance Use Topics  . Alcohol use: No  . Drug use: No     Allergies   Keflex [cephalexin] and Percocet [oxycodone-acetaminophen]   Review of Systems Review of Systems  Constitutional: Negative for fever.  HENT: Negative for congestion.   Eyes: Negative for visual disturbance.  Respiratory: Positive for shortness of breath.  Cardiovascular: Positive for chest pain. Negative for palpitations and leg swelling.  Gastrointestinal: Negative for abdominal pain.  Genitourinary: Negative for difficulty urinating.  Musculoskeletal: Negative for arthralgias.  Neurological: Negative for weakness.  Psychiatric/Behavioral: Negative for agitation.  All other systems reviewed and are negative.    Physical Exam Updated Vital Signs BP 119/69   Pulse 68   Temp 98.7 F (37.1 C) (Oral)   Resp 16   Ht 5\' 8"  (1.727 m)   Wt 108.9 kg   SpO2 100%   BMI 36.49 kg/m   Physical Exam Vitals signs and nursing note reviewed.   Constitutional:      General: She is not in acute distress.    Comments: texting on the phone comfortably  HENT:     Head: Normocephalic and atraumatic.     Nose: Nose normal.  Eyes:     Pupils: Pupils are equal, round, and reactive to light.  Neck:     Musculoskeletal: Normal range of motion and neck supple.  Cardiovascular:     Rate and Rhythm: Normal rate and regular rhythm.     Pulses: Normal pulses.     Heart sounds: Normal heart sounds.  Pulmonary:     Effort: Pulmonary effort is normal.     Breath sounds: Normal breath sounds.  Abdominal:     General: Abdomen is flat. Bowel sounds are normal.     Tenderness: There is no abdominal tenderness. There is no guarding or rebound.  Musculoskeletal: Normal range of motion.        General: No swelling or tenderness.     Right lower leg: No edema.     Left lower leg: No edema.  Skin:    General: Skin is warm and dry.     Capillary Refill: Capillary refill takes less than 2 seconds.  Neurological:     General: No focal deficit present.     Mental Status: She is alert and oriented to person, place, and time.     Deep Tendon Reflexes: Reflexes normal.  Psychiatric:        Mood and Affect: Mood normal.        Behavior: Behavior normal.      ED Treatments / Results  Labs (all labs ordered are listed, but only abnormal results are displayed) Labs Reviewed  D-DIMER, QUANTITATIVE (NOT AT [facility]) - Abnormal; Notable for the following components:      Result Value   D-Dimer, Quant 0.68 (*)    All other components within normal limits  I-STAT CHEM 8, ED - Abnormal; Notable for the following components:   Glucose, Bld 125 (*)    All other components within normal limits  CBC WITH DIFFERENTIAL/PLATELET  I-STAT BETA HCG BLOOD, ED ([facility], [facility], [facility] ONLY)  TROPONIN I (HIGH SENSITIVITY)  TROPONIN I (HIGH SENSITIVITY)    EKG EKG Interpretation  Date/Time:  Sunday July 14 2019 22:45:25 EST Ventricular Rate:  81 PR Interval:  158  QRS Duration: 76 QT Interval:  356 QTC Calculation: 413 R Axis:   33 Text Interpretation: Normal sinus rhythm Nonspecific T wave abnormality no change Confirmed by 08-19-2005, Semaj Kham (Nicanor Alcon) on 07/15/2019 12:43:35 AM   Radiology Dg Chest 2 View  Result Date: 07/14/2019 CLINICAL DATA:  Shortness of breath, covid positive EXAM: CHEST - 2 VIEW COMPARISON:  September 20, 2017 FINDINGS: The heart size and mediastinal contours are within normal limits. Both lungs are clear. The visualized skeletal structures are unremarkable. IMPRESSION: No active cardiopulmonary disease. Electronically Signed  By: Jonna ClarkBindu  Avutu M.D.   On: 07/14/2019 23:07    Procedures Procedures (including critical care time)  Medications Ordered in ED Medications  acetaminophen (TYLENOL) 500 MG tablet (has no administration in time range)  acetaminophen (TYLENOL) tablet 1,000 mg (1,000 mg Oral Given 07/15/19 0232)  iohexol (OMNIPAQUE) 350 MG/ML injection 100 mL (100 mLs Intravenous Contrast Given 07/15/19 0548)    Ruled out for MI in the ED.  HEART score is 1 very low risk for MACE.  Ruled out for PE in the ED.  Saturation has been 100% in the ED.  Stable for discharge with close follow up.    Initial Impression / Assessment and Plan / ED Course  Use tylenol for pain at home.  Start Mucinex and vitamin C and zinc.  Stay isolated in your own home.  Instructions given on your discharge paperwork.  Order a pulse oximeter from [employer]. Check your pulse ox at home.    Paula Shelton was evaluated in Emergency Department on 07/15/2019 for the symptoms described in the history of present illness. She was evaluated in the context of the global COVID-19 pandemic, which necessitated consideration that the patient might be at risk for infection with the SARS-CoV-2 virus that causes COVID-19. Institutional protocols and algorithms that pertain to the evaluation of patients at risk for COVID-19 are in a state of rapid change based on  information released by regulatory bodies including the CDC and federal and state organizations. These policies and algorithms were followed during the patient's care in the ED.  Final Clinical Impressions(s) / ED Diagnoses   Final diagnoses:  COVID-19    Return for intractable cough, coughing up blood,fevers >100.4 unrelieved by medication, shortness of breath, intractable vomiting, chest pain, shortness of breath, weakness,numbness, changes in speech, facial asymmetry,abdominal pain, passing out,Inability to tolerate liquids or food, cough, altered mental status or any concerns. No signs of systemic illness or infection. The patient is nontoxic-appearing on exam and vital signs are within normal limits.   I have reviewed the triage vital signs and the nursing notes. Pertinent labs &imaging results that were available during my care of the patient were reviewed by me and considered in my medical decision making (see chart for details).  After history, exam, and medical workup I feel the patient has been appropriately medically screened and is safe for discharge home. Pertinent diagnoses were discussed with the patient. Patient was given return precautions    Ran Tullis, MD 07/15/19 [id number]    Cy BlamerPalumbo, Maegen Wigle, MD 07/15/19 [id number]

## 2019-08-13 ENCOUNTER — Telehealth: Payer: Medicaid Other | Admitting: Physician Assistant

## 2019-08-13 DIAGNOSIS — J349 Unspecified disorder of nose and nasal sinuses: Secondary | ICD-10-CM | POA: Diagnosis not present

## 2019-08-13 DIAGNOSIS — R0981 Nasal congestion: Secondary | ICD-10-CM | POA: Diagnosis not present

## 2019-08-13 DIAGNOSIS — J029 Acute pharyngitis, unspecified: Secondary | ICD-10-CM | POA: Diagnosis not present

## 2019-08-13 MED ORDER — PHENOL 1.4 % MT LIQD: 1.0000 | OROMUCOSAL | 0 refills | Status: DC | PRN

## 2019-08-13 MED ORDER — FLUTICASONE PROPIONATE 50 MCG/ACT NA SUSP: 2.0000 | Freq: Every day | NASAL | 0 refills | Status: DC

## 2019-08-13 NOTE — Progress Notes (Signed)
We are sorry you are not feeling well.  Here is how we plan to help!  Based on what you have shared with me, it looks like you may have a viral upper respiratory infection.  Upper respiratory infections are caused by a large number of viruses; however, rhinovirus is the most common cause.   Symptoms vary from person to person, with common symptoms including sore throat, cough, fatigue or lack of energy and feeling of general discomfort.  A low-grade fever of up to 100.4 may present, but is often uncommon.  Symptoms vary however, and are closely related to a person's age or underlying illnesses.  The most common symptoms associated with an upper respiratory infection are nasal discharge or congestion, cough, sneezing, headache and pressure in the ears and face.  These symptoms usually persist for about 3 to 10 days, but can last up to 2 weeks.  It is important to know that upper respiratory infections do not cause serious illness or complications in most cases.    Upper respiratory infections can be transmitted from person to person, with the most common method of transmission being a person's hands.  The virus is able to live on the skin and can infect other persons for up to 2 hours after direct contact.  Also, these can be transmitted when someone coughs or sneezes; thus, it is important to cover the mouth to reduce this risk.  To keep the spread of the illness at bay, good hand hygiene is very important.  This is an infection that is most likely caused by a virus. There are no specific treatments other than to help you with the symptoms until the infection runs its course.  We are sorry you are not feeling well.  Here is how we plan to help!   For nasal congestion, you may use an oral decongestants such as Mucinex D or if you have glaucoma or high blood pressure use plain Mucinex.  Saline nasal spray or nasal drops can help and can safely be used as often as needed for congestion.  For your congestion,  I have prescribed Fluticasone nasal spray one spray in each nostril twice a day  If you do not have a history of heart disease, hypertension, diabetes or thyroid disease, prostate/bladder issues or glaucoma, you may also use Sudafed to treat nasal congestion.  It is highly recommended that you consult with a pharmacist or your primary care physician to ensure this medication is safe for you to take.     If you have a cough, you may use cough suppressants such as Delsym and Robitussin.  If you have glaucoma or high blood pressure, you can also use Coricidin HBP.    If you have a sore or scratchy throat, use a saltwater gargle-  to  teaspoon of salt dissolved in a 4-ounce to 8-ounce glass of warm water.  Gargle the solution for approximately 15-30 seconds and then spit.  It is important not to swallow the solution.  You can also use throat lozenges/cough drops and Chloraseptic spray to help with throat pain or discomfort.  Warm or cold liquids can also be helpful in relieving throat pain.  For headache, pain or general discomfort, you can use Ibuprofen or Tylenol as directed.   Some authorities believe that zinc sprays or the use of Echinacea may shorten the course of your symptoms.   HOME CARE . Only take medications as instructed by your medical team. . Be sure to drink plenty   of fluids. Water is fine as well as fruit juices, sodas and electrolyte beverages. You may want to stay away from caffeine or alcohol. If you are nauseated, try taking small sips of liquids. How do you know if you are getting enough fluid? Your urine should be a pale yellow or almost colorless. . Get rest. . Taking a steamy shower or using a humidifier may help nasal congestion and ease sore throat pain. You can place a towel over your head and breathe in the steam from hot water coming from a faucet. . Using a saline nasal spray works much the same way. . Cough drops, hard candies and sore throat lozenges may ease your  cough. . Avoid close contacts especially the very young and the elderly . Cover your mouth if you cough or sneeze . Always remember to wash your hands.   GET HELP RIGHT AWAY IF: . You develop worsening fever. . If your symptoms do not improve within 10 days . You develop yellow or green discharge from your nose over 3 days. . You have coughing fits . You develop a severe head ache or visual changes. . You develop shortness of breath, difficulty breathing or start having chest pain . Your symptoms persist after you have completed your treatment plan  MAKE SURE YOU   Understand these instructions.  Will watch your condition.  Will get help right away if you are not doing well or get worse.  Your e-visit answers were reviewed by a board certified advanced clinical practitioner to complete your personal care plan. Depending upon the condition, your plan could have included both over the counter or prescription medications. Please review your pharmacy choice. If there is a problem, you may call our nursing hot line at and have the prescription routed to another pharmacy. Your safety is important to [place]. If you have drug allergies check your prescription carefully.   You can use MyChart to ask questions about today's visit, request a non-urgent call back, or ask for a work or school excuse for 24 hours related to this e-Visit. If it has been greater than 24 hours you will need to follow up with your provider, or enter a new e-Visit to address those concerns. You will get an e-mail in the next two days asking about your experience.  I hope that your e-visit has been valuable and will speed your recovery. Thank you for using e-visits.  Approximately 5 minutes of time have been spent researching, coordinating, and implementing care for this patient today.

## 2019-09-26 ENCOUNTER — Encounter: Payer: Self-pay | Admitting: Family Medicine

## 2019-09-26 ENCOUNTER — Other Ambulatory Visit: Payer: Self-pay

## 2019-09-26 ENCOUNTER — Ambulatory Visit (LOCAL_COMMUNITY_HEALTH_CENTER): Payer: Medicaid Other | Admitting: Family Medicine

## 2019-09-26 VITALS — BP 111/67 | Ht 68.0 in | Wt 245.0 lb

## 2019-09-26 DIAGNOSIS — Z3009 Encounter for other general counseling and advice on contraception: Secondary | ICD-10-CM | POA: Diagnosis not present

## 2019-09-26 DIAGNOSIS — Z8742 Personal history of other diseases of the female genital tract: Secondary | ICD-10-CM | POA: Diagnosis not present

## 2019-09-26 DIAGNOSIS — Z01419 Encounter for gynecological examination (general) (routine) without abnormal findings: Secondary | ICD-10-CM

## 2019-09-26 DIAGNOSIS — Z8616 Personal history of covid-19: Secondary | ICD-10-CM | POA: Diagnosis not present

## 2019-09-26 MED ORDER — THERA VITAL M PO TABS: 1.0000 | ORAL_TABLET | Freq: Every day | ORAL | 0 refills | Status: AC

## 2019-09-26 NOTE — Patient Instructions (Signed)
Preventive Care 21-37 Years Old, Female Preventive care refers to visits with your health care provider and lifestyle choices that can promote health and wellness. This includes:  A yearly physical exam. This may also be called an annual well check.  Regular dental visits and eye exams.  Immunizations.  Screening for certain conditions.  Healthy lifestyle choices, such as eating a healthy diet, getting regular exercise, not using drugs or products that contain nicotine and tobacco, and limiting alcohol use. What can I expect for my preventive care visit? Physical exam Your health care provider will check your:  Height and weight. This may be used to calculate body mass index (BMI), which tells if you are at a healthy weight.  Heart rate and blood pressure.  Skin for abnormal spots. Counseling Your health care provider may ask you questions about your:  Alcohol, tobacco, and drug use.  Emotional well-being.  Home and relationship well-being.  Sexual activity.  Eating habits.  Work and work environment.  Method of birth control.  Menstrual cycle.  Pregnancy history. What immunizations do I need?  Influenza (flu) vaccine  This is recommended every year. Tetanus, diphtheria, and pertussis (Tdap) vaccine  You may need a Td booster every 10 years. Varicella (chickenpox) vaccine  You may need this if you have not been vaccinated. Human papillomavirus (HPV) vaccine  If recommended by your health care provider, you may need three doses over 6 months. Measles, mumps, and rubella (MMR) vaccine  You may need at least one dose of MMR. You may also need a second dose. Meningococcal conjugate (MenACWY) vaccine  One dose is recommended if you are age 19-21 years and a first-year college student living in a residence hall, or if you have one of several medical conditions. You may also need additional booster doses. Pneumococcal conjugate (PCV13) vaccine  You may need  this if you have certain conditions and were not previously vaccinated. Pneumococcal polysaccharide (PPSV23) vaccine  You may need one or two doses if you smoke cigarettes or if you have certain conditions. Hepatitis A vaccine  You may need this if you have certain conditions or if you travel or work in places where you may be exposed to hepatitis A. Hepatitis B vaccine  You may need this if you have certain conditions or if you travel or work in places where you may be exposed to hepatitis B. Haemophilus influenzae type b (Hib) vaccine  You may need this if you have certain conditions. You may receive vaccines as individual doses or as more than one vaccine together in one shot (combination vaccines). Talk with your health care provider about the risks and benefits of combination vaccines. What tests do I need?  Blood tests  Lipid and cholesterol levels. These may be checked every 5 years starting at age 20.  Hepatitis C test.  Hepatitis B test. Screening  Diabetes screening. This is done by checking your blood sugar (glucose) after you have not eaten for a while (fasting).  Sexually transmitted disease (STD) testing.  BRCA-related cancer screening. This may be done if you have a family history of breast, ovarian, tubal, or peritoneal cancers.  Pelvic exam and Pap test. This may be done every 3 years starting at age 21. Starting at age 30, this may be done every 5 years if you have a Pap test in combination with an HPV test. Talk with your health care provider about your test results, treatment options, and if necessary, the need for more tests.   Follow these instructions at home: Eating and drinking   Eat a diet that includes fresh fruits and vegetables, whole grains, lean protein, and low-fat dairy.  Take vitamin and mineral supplements as recommended by your health care provider.  Do not drink alcohol if: ? Your health care provider tells you not to drink. ? You are  pregnant, may be pregnant, or are planning to become pregnant.  If you drink alcohol: ? Limit how much you have to 0-1 drink a day. ? Be aware of how much alcohol is in your drink. In the [place] one drink equals one 12 oz bottle of beer (355 mL), one 5 oz glass of wine (148 mL), or one 1 oz glass of hard liquor (44 mL). Lifestyle  Take daily care of your teeth and gums.  Stay active. Exercise for at least 30 minutes on 5 or more days each week.  Do not use any products that contain nicotine or tobacco, such as cigarettes, e-cigarettes, and chewing tobacco. If you need help quitting, ask your health care provider.  If you are sexually active, practice safe sex. Use a condom or other form of birth control (contraception) in order to prevent pregnancy and STIs (sexually transmitted infections). If you plan to become pregnant, see your health care provider for a preconception visit. What's next?  Visit your health care provider once a year for a well check visit.  Ask your health care provider how often you should have your eyes and teeth checked.  Stay up to date on all vaccines. This information is not intended to replace advice given to you by your health care provider. Make sure you discuss any questions you have with your health care provider. Document Revised: 04/12/2018 Document Reviewed: 04/12/2018 Elsevier Patient Education  2020 [employer].

## 2019-09-26 NOTE — Progress Notes (Signed)
In for physical & pap smear;; declines HIV/RPR testing-donates plasma 2x/wk Sharlette Dense, RN

## 2019-09-26 NOTE — Progress Notes (Signed)
Family Planning Visit- Initial Visit  Subjective:  Paula Shelton is a 39 y.o. being seen today for an initial well woman visit and to discuss family planning options.  She is currently using none for pregnancy prevention. Patient reports she does not want a pregnancy in the next year.  Patient has the following medical conditions has Bipolar 1 disorder (HCC); History of major depression; History of posttraumatic stress disorder (PTSD); History of total adrenalectomy (HCC); and Mixed anxiety and depressive disorder on their problem list.  Chief Complaint  Patient presents with  . Gynecologic Exam    Patient reports giving plasma 2x per week.  2014-2018- partner was HIV pos  Reports pelvic pain for the past year, not associated with cycle. Periods are light in nature, last for 5 day. On her menses which started Tuesday.   Patient denies vaginal discharge  Body mass index is 37.25 kg/m. - Patient is eligible for diabetes screening based on BMI and age >73?  NO- at 42 will be HA1C ordered? no  Patient reports 0 of partners in last year. Desires STI screening?  No - declines   Does the patient have a current or past history of drug use? No   No components found for: HCV]   Health Maintenance Due  Topic Date Due  . TETANUS/TDAP  06/22/2002  . PAP SMEAR-Modifier  10/29/2016  . INFLUENZA VACCINE  03/16/2019    Review of Systems  Constitutional: Negative for chills and fever.  Eyes: Negative for blurred vision and double vision.  Respiratory: Negative for cough and shortness of breath.   Cardiovascular: Negative for chest pain and orthopnea.  Gastrointestinal: Negative for nausea and vomiting.  Genitourinary: Negative for dysuria, flank pain and frequency.  Musculoskeletal: Negative for myalgias.  Skin: Negative for rash.  Neurological: Negative for dizziness, tingling, weakness and headaches.  Endo/Heme/Allergies: Does not bruise/bleed easily.  Psychiatric/Behavioral:  Negative for depression and suicidal ideas. The patient is not nervous/anxious.     The following portions of the patient's history were reviewed and updated as appropriate: allergies, current medications, past family history, past medical history, past social history, past surgical history and problem list. Problem list updated.   See flowsheet for other program required questions.  Objective:   Vitals:   09/26/19 0853  BP: 111/67  Weight: 245 lb (111.1 kg)  Height: 5\' 8"  (1.727 m)    Physical Exam Vitals and nursing note reviewed.  Constitutional:      Appearance: She is well-developed.  HENT:     Head: Normocephalic and atraumatic.  Eyes:     Conjunctiva/sclera: Conjunctivae normal.  Cardiovascular:     Rate and Rhythm: Normal rate.  Pulmonary:     Effort: Pulmonary effort is normal.  Chest:     Breasts: Tanner Score is 5. Breasts are symmetrical.        Right: Normal. No inverted nipple, mass or nipple discharge.        Left: Normal. No inverted nipple, mass or nipple discharge.  Abdominal:     Palpations: Abdomen is soft.     Tenderness: There is no abdominal tenderness.  Musculoskeletal:        General: Normal range of motion.     Cervical back: Normal range of motion and neck supple.  Skin:    General: Skin is warm and dry.     Findings: No erythema.  Neurological:     Mental Status: She is alert and oriented to person, place, and time.  Assessment and Plan:  Paula Shelton is a 39 y.o. female presenting to the [facility] Department for an initial well woman exam/family planning visit  Contraception counseling: Reviewed all forms of birth control options in the tiered based approach. available including abstinence; over the counter/barrier methods; hormonal contraceptive medication including pill, patch, ring, injection,contraceptive implant; hormonal and nonhormonal IUDs; permanent sterilization options including vasectomy and the  various tubal sterilization modalities. Risks, benefits, and typical effectiveness rates were reviewed.  Questions were answered.  Written information was also given to the patient to review.  Patient desires Nothing for contraception, not sexually active. She was told to call with any further questions, or with any concerns about this method of contraception.  Emphasized use of condoms 100% of the time for STI prevention.  1. Well woman exam with routine gynecological exam Reviewed need for HA1C at age 72 Recommended mammogram at age 81 given mother with breast cancer Was initially interested in patch but is a smoker and over 31 and we discussed increased risk of blood clots/VTE. She then declined any method - IGP, Aptima HPV  2. Family planning - Multiple Vitamins-Minerals (MULTIVITAMIN) tablet; Take 1 tablet by mouth daily.  Dispense: 100 tablet; Refill: 0  3. Encounter for other general counseling or advice on contraception See above  4. History of abnormal cervical Pap smear - IGP, Aptima HPV  5. History of COVID-19    Return in about 1 year (around 09/25/2020) for Yearly wellness exam.  No future appointments.  Caren Macadam, MD

## 2019-10-02 ENCOUNTER — Telehealth: Payer: Self-pay | Admitting: Family Medicine

## 2019-10-02 LAB — IGP, APTIMA HPV
HPV Aptima: NEGATIVE
PAP Smear Comment: 0

## 2019-10-11 ENCOUNTER — Encounter: Payer: Self-pay | Admitting: Physician Assistant

## 2019-10-11 NOTE — Progress Notes (Signed)
Receive refill request for patient from [facility] for Nuva Ring.  Per note from patient visit on 09/26/2019, patient opted not to continue with hormonal BCM, since 37 or older and still smoking.  Refill request shredded.

## 2019-12-02 ENCOUNTER — Other Ambulatory Visit: Payer: Self-pay

## 2019-12-02 ENCOUNTER — Ambulatory Visit (LOCAL_COMMUNITY_HEALTH_CENTER): Payer: Medicaid Other

## 2019-12-02 VITALS — BP 113/74 | Ht 68.0 in | Wt 248.0 lb

## 2019-12-02 DIAGNOSIS — Z3202 Encounter for pregnancy test, result negative: Secondary | ICD-10-CM

## 2019-12-02 LAB — PREGNANCY, URINE: Preg Test, Ur: NEGATIVE

## 2019-12-02 NOTE — Progress Notes (Signed)
Patient reports had normal period on 10/21/19 but hasn't started period this month.  C/o lots of cramping since 11/21/19. Last intercourse 11/09/19 with condom. Co negative PT today. Patient considering BC method and reminded needs repeat pap. Request provider appt. Appt scheduled.  Richmond Campbell, RN

## 2019-12-03 ENCOUNTER — Ambulatory Visit: Payer: Medicaid Other

## 2019-12-04 ENCOUNTER — Other Ambulatory Visit: Payer: Self-pay

## 2019-12-04 ENCOUNTER — Ambulatory Visit (LOCAL_COMMUNITY_HEALTH_CENTER): Payer: Medicaid Other | Admitting: Physician Assistant

## 2019-12-04 VITALS — BP 104/64 | Ht 68.0 in | Wt 247.0 lb

## 2019-12-04 DIAGNOSIS — Z3049 Encounter for surveillance of other contraceptives: Secondary | ICD-10-CM

## 2019-12-04 DIAGNOSIS — Z3009 Encounter for other general counseling and advice on contraception: Secondary | ICD-10-CM | POA: Diagnosis not present

## 2019-12-04 DIAGNOSIS — Z01419 Encounter for gynecological examination (general) (routine) without abnormal findings: Secondary | ICD-10-CM | POA: Diagnosis not present

## 2019-12-04 MED ORDER — ETONOGESTREL-ETHINYL ESTRADIOL 0.12-0.015 MG/24HR VA RING: VAGINAL_RING | VAGINAL | 12 refills | Status: DC

## 2019-12-05 ENCOUNTER — Encounter: Payer: Self-pay | Admitting: Physician Assistant

## 2019-12-05 NOTE — Telephone Encounter (Signed)
Pt. is wanting to know if she needs to schedule an appointment based on results from last appointment.

## 2019-12-05 NOTE — Progress Notes (Signed)
WH problem visit  [facility]  Subjective:  Paula Shelton is a 39 y.o. being seen today for repeat pap and to discuss BCM options.  Chief Complaint  Patient presents with  . Contraception    HPI  Patient states that she was told that she needed to repeat her pap.  Also, states that she has skipped her period for this month but had a negative pregnancy test on Monday of this week.  Reports that over the past 1-2 months she has had to work more hours, about 16 hr shifts 5 days a week, and has not gotten much sleep.  States that she had her Nexplanon removed and had regular periods since then until last period was 10/21/2019.  Last sex 11/09/2019 with condoms.  Is interested in maybe restarting Nuva Ring to restart/resume regular periods.  Denies any vaginal symptoms. States that she is planning to d/c smoking.   Does the patient have a current or past history of drug use? No   No components found for: HCV]   Health Maintenance Due  Topic Date Due  . COVID-19 Vaccine (1) Never done  . TETANUS/TDAP  Never done    Review of Systems  All other systems reviewed and are negative.   The following portions of the patient's history were reviewed and updated as appropriate: allergies, current medications, past family history, past medical history, past social history, past surgical history and problem list. Problem list updated.   See flowsheet for other program required questions.  Objective:   Vitals:   12/04/19 1053  BP: 104/64  Weight: 247 lb (112 kg)  Height: 5\' 8"  (1.727 m)    Physical Exam Vitals reviewed.  Constitutional:      General: She is not in acute distress.    Appearance: Normal appearance.  HENT:     Head: Normocephalic and atraumatic.  Pulmonary:     Effort: Pulmonary effort is normal.  Abdominal:     Palpations: Abdomen is soft. There is no mass.     Tenderness: There is no abdominal tenderness. There is no  guarding or rebound.  Genitourinary:    General: Normal vulva.     Rectum: Normal.     Comments: External genitalia/pubic area without nits, lice, edema, erythema, lesions and inguinal adenopathy. Vagina with normal mucosa and discharge. Cervix without visible lesions. Uterus firm, mobile,nt, no masses, no CMT, no adnexal tenderness or fullness. Skin:    General: Skin is warm and dry.     Findings: No bruising, erythema, lesion or rash.  Neurological:     Mental Status: She is alert and oriented to person, place, and time.  Psychiatric:        Mood and Affect: Mood normal.        Behavior: Behavior normal.        Thought Content: Thought content normal.        Judgment: Judgment normal.       Assessment and Plan:  CHRIS NARASIMHAN is a 39 y.o. female presenting to the [facility] Department for a Women's Health problem visit  1. Encounter for counseling regarding contraception Patient requests that Rx for Nuva Ring be sent to her pharmacy. Reviewed with patient risks, benefits, SE of Nuva Ring and when to d/c. Rec condoms with all sex. - etonogestrel-ethinyl estradiol (NUVARING) 0.12-0.015 MG/24HR vaginal ring; Insert vaginally and leave in place for 3 consecutive weeks, then remove for 1 week.  Dispense:  1 each; Refill: 12  2. Pap test, as part of routine gynecological examination Counseled that RN will call or send letter re:  Pap results once results are back.  - IGP, Aptima HPV  3. Encounter for initial management of nuvaring See above. - etonogestrel-ethinyl estradiol (NUVARING) 0.12-0.015 MG/24HR vaginal ring; Insert vaginally and leave in place for 3 consecutive weeks, then remove for 1 week.  Dispense: 1 each; Refill: 12     No follow-ups on file.  No future appointments.  Jerene Dilling, PA

## 2019-12-09 LAB — IGP, APTIMA HPV
HPV Aptima: NEGATIVE
PAP Smear Comment: 0

## 2020-06-09 ENCOUNTER — Ambulatory Visit: Payer: Medicaid Other

## 2020-06-11 ENCOUNTER — Ambulatory Visit: Payer: Medicaid Other

## 2020-06-30 ENCOUNTER — Ambulatory Visit (LOCAL_COMMUNITY_HEALTH_CENTER): Payer: Medicaid Other | Admitting: Physician Assistant

## 2020-06-30 ENCOUNTER — Other Ambulatory Visit: Payer: Self-pay

## 2020-06-30 ENCOUNTER — Encounter: Payer: Self-pay | Admitting: Physician Assistant

## 2020-06-30 VITALS — BP 114/71 | Ht 68.0 in | Wt 244.0 lb

## 2020-06-30 DIAGNOSIS — Z3161 Procreative counseling and advice using natural family planning: Secondary | ICD-10-CM

## 2020-06-30 DIAGNOSIS — Z3009 Encounter for other general counseling and advice on contraception: Secondary | ICD-10-CM | POA: Diagnosis not present

## 2020-06-30 DIAGNOSIS — Z113 Encounter for screening for infections with a predominantly sexual mode of transmission: Secondary | ICD-10-CM

## 2020-06-30 LAB — WET PREP FOR TRICH, YEAST, CLUE
Trichomonas Exam: NEGATIVE
Yeast Exam: NEGATIVE

## 2020-06-30 NOTE — Progress Notes (Signed)
Wet mount reviewed and is negative today, so no treatment needed for wet mount per standing order and per provider verbal order. Pt desires pregnancy. Pt aware that if she changes her mind or desires BCM later on to give [place] a call so she can RTC, and pt states understanding. Counseled pt per provider orders and pt states understanding. Provider orders completed.

## 2020-06-30 NOTE — Progress Notes (Signed)
Pt is here for [facility] Prob visit. Pt reports she is having pain during and after sex and is concerned about that. Pt reports is thinking about having another child but hasn't been able to get pregnant yet. Pt reports in Sept or Oct 2021 thought she might have been pregnant as she was having breast tenderness and feeling really tired, but symptoms went away.

## 2020-07-01 NOTE — Progress Notes (Signed)
[facility] problem visit  [facility]  Subjective:  Paula Shelton is a 39 y.o. being seen today for pain with sex.  Chief Complaint  Patient presents with  . Contraception    Pain with sex and after sex, thinking about having another child    HPI  Patient reports that she has been having pain during and after sex for about 4 months.  Reports that she has a new partner and that with certain positions it is more uncomfortable to have sex.  Last RP was in 09/2019.  Patient is concerned that she has not gotten pregnant, but has not tracked her cycle to determine when she ovulates yet.  Reports that after she got "the vaccine" she did not have a period for 4 months but has had a period for the last 3 months in a row.  Denies vaginal symptoms except that she has the pain with sex.     Does the patient have a current or past history of drug use? No   No components found for: HCV]   Health Maintenance Due  Topic Date Due  . Hepatitis C Screening  Never done  . TETANUS/TDAP  Never done  . INFLUENZA VACCINE  Never done    Review of Systems  All other systems reviewed and are negative.   The following portions of the patient's history were reviewed and updated as appropriate: allergies, current medications, past family history, past medical history, past social history, past surgical history and problem list. Problem list updated.   See flowsheet for other program required questions.  Objective:   Vitals:   06/30/20 1030  BP: 114/71  Weight: 244 lb (110.7 kg)  Height: 5\' 8"  (1.727 m)    Physical Exam Vitals and nursing note reviewed.  Constitutional:      General: She is not in acute distress.    Appearance: Normal appearance.  HENT:     Head: Normocephalic and atraumatic.     Mouth/Throat:     Mouth: Mucous membranes are moist.     Pharynx: Oropharynx is clear. No oropharyngeal exudate or posterior oropharyngeal erythema.  Eyes:      Conjunctiva/sclera: Conjunctivae normal.  Pulmonary:     Effort: Pulmonary effort is normal.  Abdominal:     Palpations: Abdomen is soft. There is no mass.     Tenderness: There is no abdominal tenderness. There is no guarding or rebound.  Genitourinary:    General: Normal vulva.     Rectum: Normal.     Comments: External genitalia/pubic area without nits, lice, edema, erythema, lesions and inguinal adenopathy. Vagina with normal mucosa and discharge. Cervix without visible lesions. Uterus firm, mobile, nt, no masses, no CMT, no adnexal tenderness or fullness. Musculoskeletal:     Cervical back: Neck supple. No tenderness.  Lymphadenopathy:     Cervical: No cervical adenopathy.  Skin:    General: Skin is warm and dry.     Findings: No bruising, erythema, lesion or rash.  Neurological:     Mental Status: She is alert and oriented to person, place, and time.  Psychiatric:        Mood and Affect: Mood normal.        Behavior: Behavior normal.        Thought Content: Thought content normal.        Judgment: Judgment normal.       Assessment and Plan:  Paula Shelton is a 38 y.o. female  presenting to the [facility] Department for a Women's Health problem visit  1. Encounter for counseling regarding contraception Counseled patient that due to stressors in life that periods may be irregular. Counseled patient that staying with more comfortable positions for sex will help with pain and encouraged her to discuss this with her partner. Enc use of water based lubricant if needed to help reduce discomfort.  2. Screening for STD (sexually transmitted disease) Await test results.  Counseled that RN will call if needs to RTC for treatment once results are back.  - WET PREP FOR TRICH, YEAST, CLUE - Chlamydia/Gonorrhea [place] [facility] - HIV [place] [facility] - Syphilis Serology, [place] [facility]  3. Procreative counseling and advice using natural family planning Counseled  patient re: tracking cycle to determine when ovulation occurs and having sex around that time can help increase chances of pregnancy occurring.  Counseled patient re: healthy habits for healthy self to achieve pregnancy for both her and partner. Natural Family Planning handouts reviewed with patient and given to her to track cycle.     Return for annual and PRN.  No future appointments.  Matt Holmes, PA

## 2020-08-17 ENCOUNTER — Other Ambulatory Visit: Payer: Self-pay

## 2020-08-17 ENCOUNTER — Ambulatory Visit (LOCAL_COMMUNITY_HEALTH_CENTER): Payer: Medicaid Other

## 2020-08-17 DIAGNOSIS — Z111 Encounter for screening for respiratory tuberculosis: Secondary | ICD-10-CM

## 2020-08-17 DIAGNOSIS — Z0184 Encounter for antibody response examination: Secondary | ICD-10-CM

## 2020-08-17 NOTE — Progress Notes (Signed)
PPD placed today. 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 Pt reports hx chickenpox and does not have varicella vaccine documentation. No varicella vaccine listed on [facility].  ROI signed and pt sent to lab for titer. RN to call pt with titer results and place copy of results at info booth for pt to pick up. Jerel Shepherd, RN

## 2020-08-18 LAB — VARICELLA ZOSTER ANTIBODY, IGG: Varicella zoster IgG: 2824 index (ref >165)

## 2020-08-20 ENCOUNTER — Other Ambulatory Visit: Payer: Self-pay

## 2020-08-20 ENCOUNTER — Ambulatory Visit (LOCAL_COMMUNITY_HEALTH_CENTER): Payer: Medicaid Other

## 2020-08-20 DIAGNOSIS — Z111 Encounter for screening for respiratory tuberculosis: Secondary | ICD-10-CM

## 2020-08-20 DIAGNOSIS — Z0184 Encounter for antibody response examination: Secondary | ICD-10-CM

## 2020-08-20 LAB — TB SKIN TEST
Induration: 0 mm
TB Skin Test: NEGATIVE

## 2020-08-20 NOTE — Progress Notes (Signed)
PPDR 0 mm (Negative). Varicella titer results - positve given to pt and explained. Jerel Shepherd, RN

## 2020-09-03 ENCOUNTER — Other Ambulatory Visit: Payer: Self-pay | Admitting: Internal Medicine

## 2020-09-05 LAB — SARS-COV-2 RNA,(COVID-19) QUALITATIVE NAAT: SARS CoV2 RNA: DETECTED — AB

## 2020-09-21 ENCOUNTER — Ambulatory Visit (INDEPENDENT_AMBULATORY_CARE_PROVIDER_SITE_OTHER): Payer: Medicaid Other | Admitting: Obstetrics and Gynecology

## 2020-09-21 ENCOUNTER — Other Ambulatory Visit: Payer: Self-pay

## 2020-09-21 ENCOUNTER — Encounter: Payer: Self-pay | Admitting: Obstetrics and Gynecology

## 2020-09-21 VITALS — BP 112/74 | HR 88 | Ht 68.0 in | Wt 249.0 lb

## 2020-09-21 DIAGNOSIS — R102 Pelvic and perineal pain: Secondary | ICD-10-CM

## 2020-09-21 DIAGNOSIS — N643 Galactorrhea not associated with childbirth: Secondary | ICD-10-CM | POA: Diagnosis not present

## 2020-09-21 MED ORDER — ETONOGESTREL-ETHINYL ESTRADIOL 0.12-0.015 MG/24HR VA RING: VAGINAL_RING | VAGINAL | 12 refills | Status: DC

## 2020-09-21 NOTE — Progress Notes (Signed)
Pt states she skipped December period, pt states this is not normal. Pt also noticed milky discharge from breast. C/o of tender breast and pelvic pain   Pt states she had pap done at [facility] in 2021 and it was normal

## 2020-09-21 NOTE — Progress Notes (Signed)
40 yo P2 with BMI 37 presenting today for evaluation of missed menses in December. Patient reports a normal period on 08/22/20. Patient also reports onset of sharp lower abdominal pain 3 weeks ago. The pain is intermittent. She is sexually active and denies any dyspareunia. She denies any abnormal discharge. Patient also reports galactorrhea during the month of December which has since resolved. Patient is interested in restarting NuvaRing  Past Medical History:  Diagnosis Date  . Depression   . Migraines    Past Surgical History:  Procedure Laterality Date  . CHOLECYSTECTOMY    . FOOT SURGERY    . TOE SURGERY     Family History  Problem Relation Age of Onset  . Breast cancer Mother   . Depression Mother   . Diabetes Mother   . Hypertension Mother   . Migraines Mother   . Hypertension Father   . Migraines Father    Social History   Tobacco Use  . Smoking status: Current Every Day Smoker    Types: Cigarettes  . Smokeless tobacco: Never Used  Substance Use Topics  . Alcohol use: No  . Drug use: No   ROS See pertinent in HPI. All other systems reviewed and non contributory Height 5\' 8"  (1.727 m), weight 249 lb (112.9 kg), last menstrual period 08/22/2020. GENERAL: Well-developed, well-nourished female in no acute distress.  HEENT: Normocephalic, atraumatic. Sclerae anicteric.  NECK: Supple. Normal thyroid.  LUNGS: Clear to auscultation bilaterally.  HEART: Regular rate and rhythm. BREASTS: Symmetric in size. No palpable masses or lymphadenopathy, skin changes, or nipple drainage. ABDOMEN: Soft, nontender, nondistended. No organomegaly. PELVIC: Normal external female genitalia. Vagina is pink and rugated.  Normal discharge. Normal appearing cervix. Uterus is normal in size.  No adnexal mass or tenderness. EXTREMITIES: No cyanosis, clubbing, or edema, 2+ distal pulses.  A/P 40 yo with missed menses - Patient with normal pap smear 11/2019 - prolactin ordered today - pelvic  ultrasound ordered - Patient will be contacted with abnormal results - Refill on Nuvaring provided - Patient advised to keep a menstrual calendar

## 2020-09-22 LAB — PROLACTIN: Prolactin: 13.1 ng/mL (ref 4.8–23.3)

## 2020-10-05 ENCOUNTER — Inpatient Hospital Stay: Admission: RE | Admit: 2020-10-05 | Payer: Medicaid Other | Source: Ambulatory Visit

## 2020-10-05 ENCOUNTER — Other Ambulatory Visit: Payer: Medicaid Other

## 2020-12-10 ENCOUNTER — Other Ambulatory Visit: Payer: Self-pay

## 2020-12-10 ENCOUNTER — Encounter (HOSPITAL_COMMUNITY): Payer: Self-pay

## 2020-12-10 ENCOUNTER — Emergency Department (HOSPITAL_COMMUNITY): Payer: Medicaid Other

## 2020-12-10 ENCOUNTER — Emergency Department (HOSPITAL_COMMUNITY)
Admission: EM | Admit: 2020-12-10 | Discharge: 2020-12-10 | Disposition: A | Discharge status: Home / Self Care | Payer: Medicaid Other | Attending: Emergency Medicine | Admitting: Emergency Medicine

## 2020-12-10 DIAGNOSIS — Z3A01 Less than 8 weeks gestation of pregnancy: Secondary | ICD-10-CM | POA: Diagnosis not present

## 2020-12-10 DIAGNOSIS — Z8616 Personal history of COVID-19: Secondary | ICD-10-CM | POA: Insufficient documentation

## 2020-12-10 DIAGNOSIS — O3680X0 Pregnancy with inconclusive fetal viability, not applicable or unspecified: Secondary | ICD-10-CM | POA: Diagnosis not present

## 2020-12-10 DIAGNOSIS — O009 Unspecified ectopic pregnancy without intrauterine pregnancy: Secondary | ICD-10-CM

## 2020-12-10 DIAGNOSIS — F17210 Nicotine dependence, cigarettes, uncomplicated: Secondary | ICD-10-CM | POA: Diagnosis not present

## 2020-12-10 DIAGNOSIS — O209 Hemorrhage in early pregnancy, unspecified: Secondary | ICD-10-CM | POA: Diagnosis present

## 2020-12-10 DIAGNOSIS — Z23 Encounter for immunization: Secondary | ICD-10-CM | POA: Diagnosis not present

## 2020-12-10 DIAGNOSIS — N939 Abnormal uterine and vaginal bleeding, unspecified: Secondary | ICD-10-CM

## 2020-12-10 LAB — CBC WITH DIFFERENTIAL/PLATELET
Abs Immature Granulocytes: 0.04 10*3/uL (ref 0.00–0.07)
Basophils Absolute: 0 10*3/uL (ref 0.0–0.1)
Basophils Relative: 0 %
Eosinophils Absolute: 0.2 10*3/uL (ref 0.0–0.5)
Eosinophils Relative: 2 %
HCT: 36.1 % (ref 36.0–46.0)
Hemoglobin: 11.4 g/dL — ABNORMAL LOW (ref 12.0–15.0)
Immature Granulocytes: 0 %
Lymphocytes Relative: 17 %
Lymphs Abs: 1.7 10*3/uL (ref 0.7–4.0)
MCH: 28.8 pg (ref 26.0–34.0)
MCHC: 31.6 g/dL (ref 30.0–36.0)
MCV: 91.2 fL (ref 80.0–100.0)
Monocytes Absolute: 0.8 10*3/uL (ref 0.1–1.0)
Monocytes Relative: 8 %
Neutro Abs: 7.2 10*3/uL (ref 1.7–7.7)
Neutrophils Relative %: 73 %
Platelets: 292 10*3/uL (ref 150–400)
RBC: 3.96 MIL/uL (ref 3.87–5.11)
RDW: 14.1 % (ref 11.5–15.5)
WBC: 9.9 10*3/uL (ref 4.0–10.5)
nRBC: 0 % (ref 0.0–0.2)

## 2020-12-10 LAB — COMPREHENSIVE METABOLIC PANEL
ALT: 14 U/L (ref 0–44)
AST: 21 U/L (ref 15–41)
Albumin: 4.2 g/dL (ref 3.5–5.0)
Alkaline Phosphatase: 80 U/L (ref 38–126)
Anion gap: 8 (ref 5–15)
BUN: 9 mg/dL (ref 6–20)
CO2: 25 mmol/L (ref 22–32)
Calcium: 9.5 mg/dL (ref 8.9–10.3)
Chloride: 107 mmol/L (ref 98–111)
Creatinine, Ser: 0.87 mg/dL (ref 0.44–1.00)
GFR, Estimated: >60 mL/min (ref >60)
Glucose, Bld: 114 mg/dL — ABNORMAL HIGH (ref 70–99)
Potassium: 3.8 mmol/L (ref 3.5–5.1)
Sodium: 140 mmol/L (ref 135–145)
Total Bilirubin: 0.3 mg/dL (ref 0.3–1.2)
Total Protein: 7.7 g/dL (ref 6.5–8.1)

## 2020-12-10 LAB — URINALYSIS, ROUTINE W REFLEX MICROSCOPIC
Bacteria, UA: NONE SEEN
Bilirubin Urine: NEGATIVE
Glucose, UA: NEGATIVE mg/dL
Ketones, ur: NEGATIVE mg/dL
Leukocytes,Ua: NEGATIVE
Nitrite: NEGATIVE
Protein, ur: NEGATIVE mg/dL
RBC / HPF: >50 RBC/hpf — ABNORMAL HIGH (ref 0–5)
Specific Gravity, Urine: 1.018 (ref 1.005–1.030)
pH: 7 (ref 5.0–8.0)

## 2020-12-10 LAB — ABO/RH: ABO/RH(D): O POS

## 2020-12-10 LAB — TYPE AND SCREEN
ABO/RH(D): O POS
Antibody Screen: NEGATIVE

## 2020-12-10 LAB — HCG, QUANTITATIVE, PREGNANCY: hCG, Beta Chain, Quant, S: 61 m[IU]/mL — ABNORMAL HIGH (ref <5)

## 2020-12-10 MED ORDER — ACETAMINOPHEN 325 MG PO TABS
650.0000 mg | ORAL_TABLET | Freq: Once | ORAL | Status: AC
  Administered 2020-12-10: 650 mg via ORAL
  Filled 2020-12-10: qty 2

## 2020-12-10 NOTE — ED Provider Notes (Signed)
22:00: Assumed care of patient from [facility] PA-C at change of shift pending Rh/type & screen.   Please see prior provider note for full H&P. Briefly patient is a 40 year old female who presented with complaint of vaginal bleeding, her beta-hCG is 61, ultrasound without IUP, differential as below, suspected to be having a spontaneous abortion at this time.  Patient's ABO/Rh is O+.  No RhoGAM.  Will discharge home with instructions for close follow up for repeat hcg level, strict return precautions.   I discussed results, treatment plan, need for follow-up, and return precautions with the patient. Provided opportunity for questions, patient confirmed understanding and is in agreement with plan.   Results for orders placed or performed during the hospital encounter of 12/10/20  Comprehensive metabolic panel  Result Value Ref Range   Sodium 140 135 - 145 mmol/L   Potassium 3.8 3.5 - 5.1 mmol/L   Chloride 107 98 - 111 mmol/L   CO2 25 22 - 32 mmol/L   Glucose, Bld 114 (H) 70 - 99 mg/dL   BUN 9 6 - 20 mg/dL   Creatinine, Ser 4.09 0.44 - 1.00 mg/dL   Calcium 9.5 8.9 - 81.1 mg/dL   Total Protein 7.7 6.5 - 8.1 g/dL   Albumin 4.2 3.5 - 5.0 g/dL   AST 21 15 - 41 U/L   ALT 14 0 - 44 U/L   Alkaline Phosphatase 80 38 - 126 U/L   Total Bilirubin 0.3 0.3 - 1.2 mg/dL   GFR, Estimated >91 >47 mL/min   Anion gap 8 5 - 15  CBC with Differential  Result Value Ref Range   WBC 9.9 4.0 - 10.5 K/uL   RBC 3.96 3.87 - 5.11 MIL/uL   Hemoglobin 11.4 (L) 12.0 - 15.0 g/dL   HCT 82.9 56.2 - 13.0 %   MCV 91.2 80.0 - 100.0 fL   MCH 28.8 26.0 - 34.0 pg   MCHC 31.6 30.0 - 36.0 g/dL   RDW 86.5 78.4 - 69.6 %   Platelets 292 150 - 400 K/uL   nRBC 0.0 0.0 - 0.2 %   Neutrophils Relative % 73 %   Neutro Abs 7.2 1.7 - 7.7 K/uL   Lymphocytes Relative 17 %   Lymphs Abs 1.7 0.7 - 4.0 K/uL   Monocytes Relative 8 %   Monocytes Absolute 0.8 0.1 - 1.0 K/uL   Eosinophils Relative 2 %   Eosinophils Absolute 0.2 0.0  - 0.5 K/uL   Basophils Relative 0 %   Basophils Absolute 0.0 0.0 - 0.1 K/uL   Immature Granulocytes 0 %   Abs Immature Granulocytes 0.04 0.00 - 0.07 K/uL  hCG, quantitative, pregnancy  Result Value Ref Range   hCG, Beta Chain, Quant, S 61 (H) <5 mIU/mL  Urinalysis, Routine w reflex microscopic Urine, Clean Catch  Result Value Ref Range   Color, Urine YELLOW YELLOW   APPearance HAZY (A) CLEAR   Specific Gravity, Urine 1.018 1.005 - 1.030   pH 7.0 5.0 - 8.0   Glucose, UA NEGATIVE NEGATIVE mg/dL   Hgb urine dipstick LARGE (A) NEGATIVE   Bilirubin Urine NEGATIVE NEGATIVE   Ketones, ur NEGATIVE NEGATIVE mg/dL   Protein, ur NEGATIVE NEGATIVE mg/dL   Nitrite NEGATIVE NEGATIVE   Leukocytes,Ua NEGATIVE NEGATIVE   RBC / HPF >50 (H) 0 - 5 RBC/hpf   Bacteria, UA NONE SEEN NONE SEEN   Squamous Epithelial / LPF 0-5 0 - 5  Type and screen [facility]  Result Value  Ref Range   ABO/RH(D) O POS    Antibody Screen NEG    Sample Expiration      12/13/2020,2359 Performed at [facility], [address] [place], [place] [address]   ABO/Rh  Result Value Ref Range   ABO/RH(D)      O POS Performed at [facility], [address], [place], [place] [address]    US OB LESS THAN 14 WEEKS WITH [place] TRANSVAGINAL  Result Date: 12/10/2020 CLINICAL DATA:  Heavy vaginal bleeding. Estimated gestational age of [redacted] weeks, 0 days by LMP. EXAM: OBSTETRIC <14 WK [place] AND TRANSVAGINAL OB US TECHNIQUE: Both transabdominal and transvaginal ultrasound examinations were performed for complete evaluation of the gestation as well as the maternal uterus, adnexal regions, and pelvic cul-de-sac. Transvaginal technique was performed to assess early pregnancy. COMPARISON:  Pelvic ultrasound dated June 01, 2009. FINDINGS: Intrauterine gestational sac: None. Maternal uterus/adnexae: Unremarkable. No free fluid in the pelvis. IMPRESSION: 1. No IUP is visualized. By definition,  in the setting of a positive pregnancy test, this reflects a pregnancy of unknown location. Differential considerations include early normal IUP, abnormal IUP/missed abortion, or nonvisualized ectopic pregnancy. Serial beta HCG is suggested. Consider repeat pelvic ultrasound in 14 days. Electronically Signed   By: Obie Dredge M.D.   On: 12/10/2020 19:26        Kilani Joffe, Pleas Koch, PA-C 12/10/20 2332    Derwood Kaplan, MD 12/11/20 1312

## 2020-12-10 NOTE — ED Triage Notes (Signed)
Pt reports 2 positive pregnancy tests 2 days ago, but reports she started spotting yesterday. Today she is bleeding heavily with clots and cramping.

## 2020-12-10 NOTE — ED Provider Notes (Signed)
[facility]-EMERGENCY DEPT Provider Note   CSN: [id number] Arrival date & time: 12/10/20  1613     History Chief Complaint  Patient presents with  . Vaginal Bleeding    Paula Shelton is a 40 y.o. female with pertinent past medical history of bipolar 1 disorder that presents the emergency department today for vaginal bleeding.  Patient states that she is [redacted] weeks pregnant, had a positive pregnancy test at home twice.  Patient states that she started having vaginal clots and bleeding starting yesterday, has been progressive and worsening today.  Patient states that the clots are pretty small, also complaining of suprapubic pain.  Patient states that this is her third pregnancy, has had 1 abortion and 2 full term births.  Does not have a history of miscarriage.  Denies any dysuria or hematuria.  States that she was feeling fine the other day.  No dizziness, lightheadedness, fatigue or weakness.  No chills or fevers or nausea or vomiting. No cp, sob, palpitations.  Denies any chance of STDs, has been with the same partner for a long time.  Denies any pelvic pain.  No other complaints at this time.[name]  HPI     Past Medical History:  Diagnosis Date  . Depression   . Migraines     Patient Active Problem List   Diagnosis Date Noted  . History of COVID-19 09/26/2019  . Bipolar 1 disorder (HCC) 04/01/2019  . History of posttraumatic stress disorder (PTSD) 05/16/2017  . Mixed anxiety and depressive disorder 05/16/2017  . 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 History of major depression 01/21/2017    Past Surgical History:  Procedure Laterality Date  . CHOLECYSTECTOMY    . FOOT SURGERY    . TOE SURGERY       OB History    Gravida  3   Para  2   Term  2   Preterm  0   AB  1   Living  2     SAB      IAB      Ectopic      Multiple      Live Births              Family History  Problem Relation Age of Onset  . Breast cancer  Mother   . Depression Mother   . Diabetes Mother   . Hypertension Mother   . Migraines Mother   . Hypertension Father   . Migraines Father     Social History   Tobacco Use  . Smoking status: Current Every Day Smoker    Types: Cigarettes  . Smokeless tobacco: Never Used  Substance Use Topics  . Alcohol use: No  . Drug use: No    Home Medications Prior to Admission medications   Medication Sig Start Date End Date Taking? Authorizing Provider  etonogestrel-ethinyl estradiol (NUVARING) 0.12-0.015 MG/24HR vaginal ring Insert vaginally and leave in place for 3 consecutive weeks, then remove for 1 week. Patient not taking: Reported on 06/30/2020 12/04/19   Matt Holmes, PA  etonogestrel-ethinyl estradiol (NUVARING) 0.12-0.015 MG/24HR vaginal ring Insert vaginally and leave in place for 3 consecutive weeks, then remove for 1 week. 09/21/20   Constant, Peggy, MD  fluticasone (FLONASE) 50 MCG/ACT nasal spray Place 2 sprays into both nostrils daily. Patient not taking: Reported on 06/30/2020 08/13/19   Michela Pitcher A, PA-C  Multiple Vitamin (MULTIVITAMIN WITH MINERALS) TABS tablet Take 1 tablet by mouth daily.  [provider]  omeprazole (PRILOSEC) 20 MG capsule Take 20 mg by mouth daily. Pt reports does not take it every day    [provider]  Prenatal Vit-Fe Fumarate-FA (PRENATAL MULTIVITAMIN) TABS tablet Take 1 tablet by mouth daily at 12 noon. Patient not taking: Reported on 08/17/2020    [provider]  SUMAtriptan (IMITREX) 100 MG tablet Take 1 tablet (100 mg total) by mouth every 2 (two) hours as needed for migraine. May repeat in 2 hours if headache persists or recurs. Patient not taking: Reported on 08/17/2020 03/07/19   Frederik Pear A, PA-C    Allergies    Keflex [cephalexin] and Percocet [oxycodone-acetaminophen]  Review of Systems   Review of Systems  Constitutional: Negative for chills, diaphoresis, fatigue and fever.  HENT: Negative for  congestion, sore throat and trouble swallowing.   Eyes: Negative for pain and visual disturbance.  Respiratory: Negative for cough, shortness of breath and wheezing.   Cardiovascular: Negative for chest pain, palpitations and leg swelling.  Gastrointestinal: Positive for abdominal pain. Negative for abdominal distention, diarrhea, nausea and vomiting.  Genitourinary: Positive for vaginal bleeding. Negative for decreased urine volume, difficulty urinating, dyspareunia, dysuria, enuresis, frequency, genital sores, hematuria, menstrual problem, pelvic pain, urgency, vaginal discharge and vaginal pain.  Musculoskeletal: Negative for back pain, neck pain and neck stiffness.  Skin: Negative for pallor.  Neurological: Negative for dizziness, speech difficulty, weakness and headaches.  Psychiatric/Behavioral: Negative for confusion.    Physical Exam Updated Vital Signs BP 119/82 (BP Location: Right Arm)   Pulse 68   Temp 98.6 F (37 C) (Oral)   Resp 18   LMP 11/05/2020   SpO2 100%   Physical Exam Constitutional:      General: She is not in acute distress.    Appearance: Normal appearance. She is not ill-appearing, toxic-appearing or diaphoretic.  HENT:     Mouth/Throat:     Mouth: Mucous membranes are moist.     Pharynx: Oropharynx is clear.  Eyes:     General: No scleral icterus.    Extraocular Movements: Extraocular movements intact.     Pupils: Pupils are equal, round, and reactive to light.  Cardiovascular:     Rate and Rhythm: Normal rate and regular rhythm.     Pulses: Normal pulses.     Heart sounds: Normal heart sounds.  Pulmonary:     Effort: Pulmonary effort is normal. No respiratory distress.     Breath sounds: Normal breath sounds. No stridor. No wheezing, rhonchi or rales.  Chest:     Chest wall: No tenderness.  Abdominal:     General: Abdomen is flat. There is no distension.     Palpations: Abdomen is soft.     Tenderness: There is no abdominal tenderness. There is  no guarding or rebound.  Genitourinary:    Comments: Chaperone present.  Patient has multiple blood clots coming from cervix, cervix looks mildly dilated, may be about 1 cm.  Bleeding been controlled with alligator forceps and gauze.  No vaginal discharge or vaginal lesions.  No cervical motion tenderness. Musculoskeletal:        General: No swelling or tenderness. Normal range of motion.     Cervical back: Normal range of motion and neck supple. No rigidity.     Right lower leg: No edema.     Left lower leg: No edema.  Skin:    General: Skin is warm and dry.     Capillary Refill: Capillary refill takes less than  2 seconds.     Coloration: Skin is not pale.  Neurological:     General: No focal deficit present.     Mental Status: She is alert and oriented to person, place, and time.  Psychiatric:        Mood and Affect: Mood normal.        Behavior: Behavior normal.     ED Results / Procedures / Treatments   Labs (all labs ordered are listed, but only abnormal results are displayed) Labs Reviewed  COMPREHENSIVE METABOLIC PANEL - Abnormal; Notable for the following components:      Result Value   Glucose, Bld 114 (*)    All other components within normal limits  CBC WITH DIFFERENTIAL/PLATELET - Abnormal; Notable for the following components:   Hemoglobin 11.4 (*)    All other components within normal limits  HCG, QUANTITATIVE, PREGNANCY - Abnormal; Notable for the following components:   hCG, Beta Chain, Quant, S 61 (*)    All other components within normal limits  URINALYSIS, ROUTINE W REFLEX MICROSCOPIC - Abnormal; Notable for the following components:   APPearance HAZY (*)    Hgb urine dipstick LARGE (*)    RBC / HPF >50 (*)    All other components within normal limits  TYPE AND SCREEN  ABO/RH    EKG None  Radiology US OB LESS THAN 14 WEEKS WITH OB TRANSVAGINAL  Result Date: 12/10/2020 CLINICAL DATA:  Heavy vaginal bleeding. Estimated gestational age of [redacted] weeks, 0  days by LMP. EXAM: OBSTETRIC <14 WK [place] AND TRANSVAGINAL OB US TECHNIQUE: Both transabdominal and transvaginal ultrasound examinations were performed for complete evaluation of the gestation as well as the maternal uterus, adnexal regions, and pelvic cul-de-sac. Transvaginal technique was performed to assess early pregnancy. COMPARISON:  Pelvic ultrasound dated June 01, 2009. FINDINGS: Intrauterine gestational sac: None. Maternal uterus/adnexae: Unremarkable. No free fluid in the pelvis. IMPRESSION: 1. No IUP is visualized. By definition, in the setting of a positive pregnancy test, this reflects a pregnancy of unknown location. Differential considerations include early normal IUP, abnormal IUP/missed abortion, or nonvisualized ectopic pregnancy. Serial beta HCG is suggested. Consider repeat pelvic ultrasound in 14 days. Electronically Signed   By: Obie Dredge M.D.   On: 12/10/2020 19:26    Procedures Procedures   Medications Ordered in ED Medications  acetaminophen (TYLENOL) tablet 650 mg (650 mg Oral Given 12/10/20 2113)    ED Course  I have reviewed the triage vital signs and the nursing notes.  Pertinent labs & imaging results that were available during my care of the patient were reviewed by me and considered in my medical decision making (see chart for details).    MDM Rules/Calculators/A&P                          Paula Shelton is a 41 y.o. female with pertinent past medical history of bipolar 1 disorder that presents the emergency department today for vaginal bleeding.  Patient does have significant blood clots coming from cervix, appears slightly dilated may be about a centimeter dilated.  Patient is hemodynamically stable, concern for miscarriage at this time.  Work-up today shows hemoglobin of 11.4.  hCG 61, ultrasound does not show IUP.  I think in this setting patient most likely had miscarriage.   Rh is pending.Pt care was handed off to to PA-C S. Petrucelli .  Complete  history and physical and current plan have been communicated.  Please  refer to their note for the remainder of ED care and ultimate disposition. Rhogam pending on RH.   I discussed this case with my attending physician who cosigned this note including patient's presenting symptoms, physical exam, and planned diagnostics and interventions. Attending physician stated agreement with plan or made changes to plan which were implemented.   Final Clinical Impression(s) / ED Diagnoses Final diagnoses:  Vaginal bleeding    Rx / DC Orders ED Discharge Orders    None       Farrel Gordonatel, Kierria Feigenbaum, PA-C 12/10/20 2206    Derwood KaplanNanavati, Ankit, MD 12/10/20 2252

## 2020-12-10 NOTE — Discharge Instructions (Addendum)
Your symptoms are concerning for a miscarriage as we spoke about. Please follow up with the MAU within the next 48 hours as you will need your blood work rechecked, their information is provided.  If you have any new or worsening symptoms such as fatigue, weakness, dizziness, passing out, fevers, new/worsening pain, or any other concerns return to the ER immediately.   It is very important that you see an OBGYN within the next 48 hours and follow up for repeat Hcg levels. You can take Tylenol for the pain as needed.   Get help right away if: You have very bad cramps or pain in your back or belly. You soak more than 2 large pads in an hour for more than 2 hours. You get light-headed or weak. You faint. You feel sad, and you have sad thoughts a lot of the time. You think about hurting yourself.

## 2020-12-10 NOTE — ED Provider Notes (Signed)
MSE was initiated and I personally evaluated the patient and placed orders (if any) at  4:31 PM on December 10, 2020.  The patient appears stable so that the remainder of the MSE may be completed by another provider.   Chief Complaint: vaginal bleeding, pregnant [redacted] weeks    HPI:  Pt states that she is pregnant, started having some vaginal bleeding and clots that began last night. Smaller clots. Lower abdominal pain. No hx of miscarriage.    ROS: vaginal bleeding, abdominal pain    Physical Exam:  Gen:                Awake, no distress  HEENT:          Atraumatic  Resp:              Normal effort  Cardiac:          Normal rate  Abd:                Nondistended, nontender  MSK:              Moves extremities without difficulty  Neuro:            Speech clear,EOMS intact      Initiation of care has begun. The patient has been counseled on the process, plan, and necessity for staying for the completion/evaluation, and the remainder of the medical screening examination    Farrel Gordon, PA-C 12/10/20 1705    Mancel Bale, MD 12/11/20 [phone]

## 2020-12-11 ENCOUNTER — Encounter (HOSPITAL_COMMUNITY): Payer: Self-pay | Admitting: Obstetrics & Gynecology

## 2020-12-11 ENCOUNTER — Inpatient Hospital Stay (HOSPITAL_COMMUNITY)
Admission: AD | Admit: 2020-12-11 | Discharge: 2020-12-11 | Disposition: A | Discharge status: Home / Self Care | Payer: Medicaid Other | Attending: Obstetrics & Gynecology | Admitting: Obstetrics & Gynecology

## 2020-12-11 ENCOUNTER — Other Ambulatory Visit: Payer: Self-pay

## 2020-12-11 DIAGNOSIS — R109 Unspecified abdominal pain: Secondary | ICD-10-CM | POA: Insufficient documentation

## 2020-12-11 DIAGNOSIS — Z3A01 Less than 8 weeks gestation of pregnancy: Secondary | ICD-10-CM | POA: Insufficient documentation

## 2020-12-11 DIAGNOSIS — O3680X0 Pregnancy with inconclusive fetal viability, not applicable or unspecified: Secondary | ICD-10-CM | POA: Diagnosis not present

## 2020-12-11 DIAGNOSIS — O209 Hemorrhage in early pregnancy, unspecified: Secondary | ICD-10-CM | POA: Diagnosis not present

## 2020-12-11 DIAGNOSIS — R42 Dizziness and giddiness: Secondary | ICD-10-CM | POA: Diagnosis not present

## 2020-12-11 DIAGNOSIS — O26891 Other specified pregnancy related conditions, first trimester: Secondary | ICD-10-CM | POA: Diagnosis not present

## 2020-12-11 LAB — CBC
HCT: 34.8 % — ABNORMAL LOW (ref 36.0–46.0)
Hemoglobin: 11 g/dL — ABNORMAL LOW (ref 12.0–15.0)
MCH: 28.6 pg (ref 26.0–34.0)
MCHC: 31.6 g/dL (ref 30.0–36.0)
MCV: 90.6 fL (ref 80.0–100.0)
Platelets: 285 10*3/uL (ref 150–400)
RBC: 3.84 MIL/uL — ABNORMAL LOW (ref 3.87–5.11)
RDW: 14.1 % (ref 11.5–15.5)
WBC: 10.7 10*3/uL — ABNORMAL HIGH (ref 4.0–10.5)
nRBC: 0 % (ref 0.0–0.2)

## 2020-12-11 NOTE — Discharge Instructions (Signed)
Miscarriage A miscarriage is the loss of pregnancy before the 20th week. Most miscarriages happen during the first 3 months of pregnancy. Sometimes, a miscarriage can happen before a woman knows that she is pregnant. Having a miscarriage can be an emotional experience. If you have had a miscarriage, talk with your health care provider about any questions you may have about the loss of your baby, the grieving process, and your plans for future pregnancy. What are the causes? Many times, the cause of a miscarriage is not known. What increases the risk? The following factors may make a pregnant woman more likely to have a miscarriage: Certain medical conditions  Conditions that affect the hormone balance in the body, such as thyroid disease or polycystic ovary syndrome.  Diabetes.  Autoimmune disorders.  Infections.  Bleeding disorders.  Obesity. Lifestyle factors  Using products with tobacco or nicotine in them or being exposed to tobacco smoke.  Having alcohol.  Having large amounts of caffeine.  Recreational drug use. Problems with reproductive organs or structures  Cervical insufficiency. This is when the lowest part of the uterus (cervix) opens and thins before pregnancy is at term.  Having a condition called Asherman syndrome. This syndrome causes scarring in the uterus or causes the uterus to be abnormal in structure.  Fibrous growths, called fibroids, in the uterus.  Congenital abnormalities. These problems are present at birth.  Infection of the cervix or uterus. Personal or medical history  Injury (trauma).  Having had a miscarriage before.  Being younger than age 18 or older than age 35.  Exposure to harmful substances in the environment. This may include radiation or heavy metals, such as lead.  Use of certain medicines. What are the signs or symptoms? Symptoms of this condition include:  Vaginal bleeding or spotting, with or without cramps or  pain.  Pain or cramping in the abdomen or lower back.  Fluid or tissue coming out of the vagina. How is this diagnosed? This condition may be diagnosed based on:  A physical exam.  Ultrasound.  Lab tests, such as blood tests, urine tests, or swabs for infection. How is this treated? Treatment for a miscarriage is sometimes not needed if all the pregnancy tissue that was in the uterus comes out on its own, and there are no other problems such as infection or heavy bleeding. In other cases, this condition may be treated with:  Dilation and curettage (D&C). In this procedure, the cervix is stretched open and any remaining pregnancy tissue is removed from the lining of the uterus (endometrium).  Medicines. These may include: ? Antibiotic medicine, to treat infection. ? Medicine to help any remaining pregnancy tissue come out of the body. ? Medicine to reduce (contract) the size of the uterus. These medicines may be given if there is a lot of bleeding. If you have Rh-negative blood, you may be given an injection of a medicine called Rho(D) immune globulin. This medicine helps prevent problems with future pregnancies. Follow these instructions at home: Medicines  Take over-the-counter and prescription medicines only as told by your health care provider.  If you were prescribed antibiotic medicine, take it as told by your health care provider. Do not stop taking the antibiotic even if you start to feel better. Activity  Rest as told by your health care provider. Ask your health care provider what activities are safe for you.  Have someone help with home and family responsibilities during this time. General instructions  Monitor how much tissue   or blood clot material comes out of the vagina.  Do not have sex, douche, or put anything, such as tampons, in your vagina until your health care provider says it is okay.  To help you and your partner with the grieving process, talk with your  health care provider or get counseling.  When you are ready, meet with your health care provider to discuss any important steps you should take for your health. Also, discuss steps you should take to have a healthy pregnancy in the future.  Keep all follow-up visits. This is important.   Where to find more information  The American College of Obstetricians and Gynecologists: acog.org  [place] Department of Health and Human Services Office of Women's Health: hrsa.gov/office-womens-health Contact a health care provider if:  You have a fever or chills.  There is bad-smelling fluid coming from the vagina.  You have more bleeding instead of less.  Tissue or blood clots come out of your vagina. Get help right away if:  You have severe cramps or pain in your back or abdomen.  Heavy bleeding soaks through 2 large sanitary pads an hour for more than 2 hours.  You become light-headed or weak.  You faint.  You feel sad, and your sadness takes over your thoughts.  You think about hurting yourself. If you ever feel like you may hurt yourself or others, or have thoughts about taking your own life, get help right away. Go to your nearest emergency department or:  Call your local emergency services (911 in the [place]  Call a suicide crisis helpline, such as the National Suicide Prevention Lifeline at 1-800-273-8255. This is open 24 hours a day in the [place]  Text the Crisis Text Line at 741741 (in the [place] Summary  Most miscarriages happen in the first 3 months of pregnancy. Sometimes miscarriage happens before a woman knows that she is pregnant.  Follow instructions from your health care provider about medicines and activity.  To help you and your partner with grieving, talk with your health care provider or get counseling.  Keep all follow-up visits. This information is not intended to replace advice given to you by your health care provider. Make sure you discuss any questions you  have with your health care provider. Document Revised: 01/31/2020 Document Reviewed: 01/31/2020 Elsevier Patient Education  2021 Elsevier Inc.  

## 2020-12-11 NOTE — MAU Provider Note (Signed)
Event Date/Time   First Provider Initiated Contact with Patient 12/11/20 2038      S Ms. Paula Shelton is a 40 y.o. [phone] patient who presents to MAU today with complaint of lightheadedness and dizziness.  She states yesterday her bleeding was like "a menstrual cycle and a half."  She reports today her bleeding is like "a normal menstrual cycle."  She reports usage of toilet paper that she changes once an hour.  She reports she has eaten today without difficulty. She endorses pain in the form of cramping in the pelvic area.  She states pain is 3/10 and does not require any intervention.    O BP 116/72 (BP Location: Right Arm)   Pulse 78   Temp 99.3 F (37.4 C) (Oral)   Resp 18   Ht 5\' 8"  (1.727 m)   Wt 113.9 kg   LMP 11/04/2020   SpO2 100%   BMI 38.16 kg/m  Physical Exam Constitutional:      Appearance: Normal appearance.  HENT:     Head: Normocephalic and atraumatic.  Eyes:     Conjunctiva/sclera: Conjunctivae normal.  Cardiovascular:     Rate and Rhythm: Normal rate.  Pulmonary:     Effort: Pulmonary effort is normal. No respiratory distress.  Skin:    General: Skin is warm and dry.  Neurological:     Mental Status: She is alert and oriented to person, place, and time.  Psychiatric:        Mood and Affect: Mood normal.        Behavior: Behavior normal.        Thought Content: Thought content normal.     A Medical screening exam complete Dizziness  P -Informed that decreasing hemoglobin can be contributing to feelings of dizziness and lightheadedness. -Also encouraged appropriate hydration, protein intake, and position changes from sitting to standing slowly.  -Discussed plan to obtain CBC tonight and if abnormal will provide treatment tomorrow. -Patient informed of need to repeat hCG in 48 hours and should return tomorrow after 5pm. -Patient agreeable with plan. -CBC ordered and collected. -Will review results. -Patient to return tomorrow for repeat  hCG -Discharge from MAU in stable condition.  11/06/2020, CNM 12/11/2020 8:39 PM

## 2020-12-11 NOTE — MAU Note (Signed)
Has paperwork from the emergency saying she had a miscarriage. Was told she needed to be seen to have blood work repeated. Is feeling weak and tired. Is changing every 2 hrs, pads are not soaked. Has slowed down.  Like a regular menstrual cycle with clots. Was having pain earlier at work, not as bad now.    found out preg on Tue, started spotting on Wed.  She is more concerned with the feeling weakness and dizziness.

## 2020-12-12 ENCOUNTER — Inpatient Hospital Stay (HOSPITAL_COMMUNITY)
Admission: AD | Admit: 2020-12-12 | Discharge: 2020-12-12 | Disposition: A | Discharge status: Home / Self Care | Payer: Medicaid Other | Attending: Obstetrics and Gynecology | Admitting: Obstetrics and Gynecology

## 2020-12-12 DIAGNOSIS — O039 Complete or unspecified spontaneous abortion without complication: Secondary | ICD-10-CM | POA: Diagnosis not present

## 2020-12-12 DIAGNOSIS — O3680X0 Pregnancy with inconclusive fetal viability, not applicable or unspecified: Secondary | ICD-10-CM | POA: Diagnosis not present

## 2020-12-12 DIAGNOSIS — Z3A01 Less than 8 weeks gestation of pregnancy: Secondary | ICD-10-CM | POA: Diagnosis not present

## 2020-12-12 LAB — HCG, QUANTITATIVE, PREGNANCY: hCG, Beta Chain, Quant, S: 31 m[IU]/mL — ABNORMAL HIGH (ref <5)

## 2020-12-12 NOTE — MAU Note (Signed)
Pt reports to mau for follow up lab work.  Pt reports bleeding has lightened up and denies any pain today.

## 2020-12-12 NOTE — MAU Provider Note (Addendum)
History   Chief Complaint:  Follow-up   Paula Shelton is  40 y.o. [id number] Patient's last menstrual period was 11/04/2020.[name] Patient is here for follow up of quantitative HCG and ongoing surveillance of pregnancy status. She is [redacted]w[redacted]d weeks gestation  by LMP.   Was seen at [facility] on 4/28 for vaginal bleeding. Had ultrasound that showed no IUP & HCG of 61.   Since her last visit, the patient is without new complaint. The patient reports bleeding as  spotting.  She denies any pain.  General ROS:  negative  Physical Exam   Blood pressure 129/73, pulse 81, temperature 98.3 F (36.8 C), temperature source Oral, resp. rate 16, last menstrual period 11/04/2020, SpO2 99 %.  Physical Examination: General appearance - alert, well appearing, and in no distress Mental status - normal mood, behavior, speech, dress, motor activity, and thought processes Eyes - sclera anicteric Chest - normal respiratory effort   Labs: Results for orders placed or performed during the hospital encounter of 12/12/20 (from the past 24 hour(s))  hCG, quantitative, pregnancy   Collection Time: 12/12/20  6:42 PM  Result Value Ref Range   hCG, Beta Chain, Quant, S 31 (H) <5 mIU/mL  Results for orders placed or performed during the hospital encounter of 12/11/20 (from the past 24 hour(s))  CBC   Collection Time: 12/11/20  8:49 PM  Result Value Ref Range   WBC 10.7 (H) 4.0 - 10.5 K/uL   RBC 3.84 (L) 3.87 - 5.11 MIL/uL   Hemoglobin 11.0 (L) 12.0 - 15.0 g/dL   HCT 56.2 (L) 56.3 - 89.3 %   MCV 90.6 80.0 - 100.0 fL   MCH 28.6 26.0 - 34.0 pg   MCHC 31.6 30.0 - 36.0 g/dL   RDW 73.4 28.7 - 68.1 %   Platelets 285 150 - 400 K/uL   nRBC 0.0 0.0 - 0.2 %    MDM: Pt stable. RH positive. Bleeding improved.  HCG results pending. Care turned over to [facility] CNM  Judeth Horn, NP 12/12/2020, 7:37 PM  Patient called with results and informed that drop in HCG is indicative of miscarriage. Discussed follow up plan at  length. Patient requested ultrasound pictures and CNM explained that no pictures were given because nothing was seen in the uterus. Patient verbalized understanding.   1. Miscarriage   2. [redacted] weeks gestation of pregnancy    -Discharge home in stable condition -Vaginal bleeding and pain precautions discussed -Patient advised to follow-up with [facility] in 1 week for repeat HCG and 2 weeks with a provider, message sent -Patient may return to MAU as needed or if her condition were to change or worsen  Rolm Bookbinder, CNM 12/12/20 8:25 PM

## 2020-12-19 ENCOUNTER — Emergency Department
Admission: EM | Admit: 2020-12-19 | Discharge: 2020-12-20 | Disposition: A | Discharge status: Home / Self Care | Payer: No Typology Code available for payment source | Attending: Emergency Medicine | Admitting: Emergency Medicine

## 2020-12-19 ENCOUNTER — Other Ambulatory Visit: Payer: Self-pay

## 2020-12-19 ENCOUNTER — Emergency Department: Payer: No Typology Code available for payment source

## 2020-12-19 DIAGNOSIS — R519 Headache, unspecified: Secondary | ICD-10-CM | POA: Diagnosis not present

## 2020-12-19 DIAGNOSIS — R1032 Left lower quadrant pain: Secondary | ICD-10-CM | POA: Insufficient documentation

## 2020-12-19 DIAGNOSIS — F17210 Nicotine dependence, cigarettes, uncomplicated: Secondary | ICD-10-CM | POA: Diagnosis not present

## 2020-12-19 DIAGNOSIS — Y92410 Unspecified street and highway as the place of occurrence of the external cause: Secondary | ICD-10-CM | POA: Diagnosis not present

## 2020-12-19 DIAGNOSIS — M25512 Pain in left shoulder: Secondary | ICD-10-CM | POA: Diagnosis not present

## 2020-12-19 DIAGNOSIS — M25511 Pain in right shoulder: Secondary | ICD-10-CM | POA: Diagnosis not present

## 2020-12-19 DIAGNOSIS — Z8616 Personal history of COVID-19: Secondary | ICD-10-CM | POA: Insufficient documentation

## 2020-12-19 DIAGNOSIS — R1030 Lower abdominal pain, unspecified: Secondary | ICD-10-CM

## 2020-12-19 LAB — CBC WITH DIFFERENTIAL/PLATELET
Abs Immature Granulocytes: 0.02 10*3/uL (ref 0.00–0.07)
Basophils Absolute: 0 10*3/uL (ref 0.0–0.1)
Basophils Relative: 0 %
Eosinophils Absolute: 0.2 10*3/uL (ref 0.0–0.5)
Eosinophils Relative: 2 %
HCT: 35.6 % — ABNORMAL LOW (ref 36.0–46.0)
Hemoglobin: 11.4 g/dL — ABNORMAL LOW (ref 12.0–15.0)
Immature Granulocytes: 0 %
Lymphocytes Relative: 21 %
Lymphs Abs: 1.8 10*3/uL (ref 0.7–4.0)
MCH: 28.9 pg (ref 26.0–34.0)
MCHC: 32 g/dL (ref 30.0–36.0)
MCV: 90.1 fL (ref 80.0–100.0)
Monocytes Absolute: 0.7 10*3/uL (ref 0.1–1.0)
Monocytes Relative: 8 %
Neutro Abs: 5.8 10*3/uL (ref 1.7–7.7)
Neutrophils Relative %: 69 %
Platelets: 290 10*3/uL (ref 150–400)
RBC: 3.95 MIL/uL (ref 3.87–5.11)
RDW: 14.2 % (ref 11.5–15.5)
WBC: 8.6 10*3/uL (ref 4.0–10.5)
nRBC: 0 % (ref 0.0–0.2)

## 2020-12-19 LAB — COMPREHENSIVE METABOLIC PANEL
ALT: 15 U/L (ref 0–44)
AST: 22 U/L (ref 15–41)
Albumin: 4.2 g/dL (ref 3.5–5.0)
Alkaline Phosphatase: 70 U/L (ref 38–126)
Anion gap: 6 (ref 5–15)
BUN: 7 mg/dL (ref 6–20)
CO2: 26 mmol/L (ref 22–32)
Calcium: 8.7 mg/dL — ABNORMAL LOW (ref 8.9–10.3)
Chloride: 108 mmol/L (ref 98–111)
Creatinine, Ser: 0.82 mg/dL (ref 0.44–1.00)
GFR, Estimated: >60 mL/min (ref >60)
Glucose, Bld: 89 mg/dL (ref 70–99)
Potassium: 3.9 mmol/L (ref 3.5–5.1)
Sodium: 140 mmol/L (ref 135–145)
Total Bilirubin: 0.4 mg/dL (ref 0.3–1.2)
Total Protein: 7.4 g/dL (ref 6.5–8.1)

## 2020-12-19 LAB — HCG, QUANTITATIVE, PREGNANCY: hCG, Beta Chain, Quant, S: 3 m[IU]/mL (ref <5)
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
## 2020-12-21 ENCOUNTER — Other Ambulatory Visit: Payer: Medicaid Other

## 2020-12-28 ENCOUNTER — Ambulatory Visit: Payer: Medicaid Other | Admitting: Obstetrics & Gynecology

## 2021-02-10 ENCOUNTER — Emergency Department: Payer: Medicaid Other

## 2021-02-10 ENCOUNTER — Emergency Department
Admission: EM | Admit: 2021-02-10 | Discharge: 2021-02-10 | Disposition: A | Discharge status: Home / Self Care | Payer: Medicaid Other | Attending: Emergency Medicine | Admitting: Emergency Medicine

## 2021-02-10 ENCOUNTER — Other Ambulatory Visit: Payer: Self-pay

## 2021-02-10 DIAGNOSIS — R519 Headache, unspecified: Secondary | ICD-10-CM | POA: Diagnosis present

## 2021-02-10 DIAGNOSIS — F17210 Nicotine dependence, cigarettes, uncomplicated: Secondary | ICD-10-CM | POA: Insufficient documentation

## 2021-02-10 DIAGNOSIS — B349 Viral infection, unspecified: Secondary | ICD-10-CM | POA: Insufficient documentation

## 2021-02-10 DIAGNOSIS — Z203 Contact with and (suspected) exposure to rabies: Secondary | ICD-10-CM | POA: Diagnosis not present

## 2021-02-10 DIAGNOSIS — Z2914 Encounter for prophylactic rabies immune globin: Secondary | ICD-10-CM | POA: Diagnosis not present

## 2021-02-10 DIAGNOSIS — Z8616 Personal history of COVID-19: Secondary | ICD-10-CM | POA: Insufficient documentation

## 2021-02-10 DIAGNOSIS — Z23 Encounter for immunization: Secondary | ICD-10-CM | POA: Diagnosis not present

## 2021-02-10 MED ORDER — RABIES VACCINE, PCEC IM SUSR
1.0000 mL | Freq: Once | INTRAMUSCULAR | Status: AC
  Administered 2021-02-10: 1 mL via INTRAMUSCULAR
  Filled 2021-02-10: qty 1

## 2021-02-10 MED ORDER — RABIES IMMUNE GLOBULIN 150 UNIT/ML IM INJ
20.0000 [IU]/kg | INJECTION | Freq: Once | INTRAMUSCULAR | Status: AC
  Administered 2021-02-10: 2250 [IU] via INTRAMUSCULAR
  Filled 2021-02-10: qty 15

## 2021-02-10 NOTE — ED Provider Notes (Signed)
[facility] Emergency Department Provider Note ____________________________________________   Event Date/Time   First MD Initiated Contact with Patient 02/10/21 1728     (approximate)  I have reviewed the triage vital signs and the nursing notes.   HISTORY  Chief Complaint Rabies Injection  HPI Paula Shelton is a 40 y.o. female with history of Bipolar, mixed anxiety and depressive disorder presents to the emergency department for treatment and evaluation after discovering that she has bats living in the vent over her stovetop. She is afraid that she and her family have come into contact with the feces. One of her main concerns is that she may have histoplasmosis. She has had a random headache and intermittent chest heaviness for a week or more. Her daughter also had cold symptoms last week, but is better now. No cough, shortness of breath, or fever.      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 Authorizing Provider  Multiple Vitamin (MULTIVITAMIN WITH MINERALS) TABS tablet Take 1 tablet by mouth daily.    [provider]  omeprazole (PRILOSEC) 20 MG capsule Take 20 mg by mouth daily. Pt reports does not take it every day    [provider]  etonogestrel-ethinyl estradiol (NUVARING) 0.12-0.015 MG/24HR vaginal ring Insert vaginally and leave in place for 3 consecutive weeks, then remove for 1 week.  09/21/20 12/10/20  Constant, Peggy, MD  fluticasone (FLONASE) 50 MCG/ACT nasal spray Place 2 sprays into both nostrils daily. Patient not taking: Reported on 06/30/2020 08/13/19 12/10/20  Michela Pitcher A, PA-C  SUMAtriptan (IMITREX) 100 MG tablet Take 1 tablet (100 mg total) by mouth every 2 (two) hours as needed for migraine. May repeat in 2 hours if headache persists or recurs. Patient not taking: Reported on 08/17/2020 03/07/19 12/10/20  Frederik Pear A, PA-C    Allergies Keflex [cephalexin] and Percocet [oxycodone-acetaminophen]  Family History  Problem Relation Age of Onset   Breast cancer Mother    Depression Mother    Diabetes Mother    Hypertension Mother    Migraines Mother    Hypertension Father    Migraines Father     Social History Social History   Tobacco Use   Smoking status: Every Day    Pack years: 0.00    Types: Cigarettes   Smokeless tobacco: Never  Substance Use Topics   Alcohol use: No   Drug use: No    Review of Systems  Constitutional: No fever/chills Eyes: No visual changes. ENT: No sore throat. Cardiovascular: Intermittent chest pressure. Respiratory: Denies shortness of breath. Gastrointestinal: No abdominal pain.  No nausea, no vomiting. Genitourinary: Negative for dysuria. Musculoskeletal: Negative for back pain. Skin: Negative for rash. Neurological: Positive for headaches, negative for focal weakness or numbness. ____________________________________________   PHYSICAL EXAM:  VITAL SIGNS: ED Triage Vitals  Enc Vitals Group     BP 02/10/21  1601 110/77     Pulse Rate 02/10/21 1601 69     Resp 02/10/21 1601 18     Temp 02/10/21 1601 98.5 F (36.9 C)     Temp Source 02/10/21 1601 Oral     SpO2 02/10/21 1601 100 %     Weight 02/10/21 1550 248 lb 14.4 oz (112.9 kg)     Height 02/10/21 1550 5\' 7"  (1.702 m)     Head Circumference --      Peak Flow --      Pain Score 02/10/21 1550 0     Pain Loc --      Pain Edu? --      Excl. in GC? --      Constitutional: Alert and oriented. Well appearing and in no acute distress. Eyes: Conjunctivae are normal.  Head: Atraumatic. Nose: No congestion/rhinnorhea. Mouth/Throat: Mucous membranes are moist. Oropharynx non-erythematous. Neck: No stridor.   Hematological/Lymphatic/Immunilogical: No cervical lymphadenopathy. Cardiovascular: Normal rate, regular rhythm. Grossly normal heart sounds.  Good peripheral circulation. Respiratory: Normal respiratory effort.  No retractions. Lungs CTAB. Gastrointestinal: Soft and nontender. No distention. No abdominal bruits. No CVA tenderness. Genitourinary:  Musculoskeletal: No lower extremity tenderness nor edema.  No joint effusions. Neurologic:  Normal speech and language. No gross focal neurologic deficits are appreciated. No gait instability. Skin:  Skin is warm, dry and intact. No rash noted. Psychiatric: Mood and affect are normal. Speech and behavior are normal.  ____________________________________________   LABS (all labs ordered are listed, but only abnormal results are displayed)  Labs Reviewed - No data to display ____________________________________________  EKG  Not indicated ____________________________________________  RADIOLOGY  ED MD interpretation:    Chest x-ray negative for acute cardiopulmonary abnormalities.  I, 02/12/21, personally viewed and evaluated these images (plain radiographs) as part of my medical decision making, as well as reviewing the written report by the radiologist.  Official radiology report(s): DG Chest 2 View  Result Date: 02/10/2021 CLINICAL DATA:  40 year old female with chest pain. EXAM: CHEST - 2 VIEW COMPARISON:  Chest radiograph dated 07/14/2019 FINDINGS: No focal consolidation, pleural effusion, or pneumothorax. A 6 mm nodular density over the left lower lung field laterally noted which may be artifactual. Attention on follow-up imaging recommended. The cardiac silhouette is within  normal limits. No acute osseous pathology. IMPRESSION: No active cardiopulmonary disease. Electronically Signed   By: 07/16/2019 M.D.   On: 02/10/2021 18:58    ____________________________________________   PROCEDURES  Procedure(s) performed (including Critical Care):  Procedures  ____________________________________________   INITIAL IMPRESSION / ASSESSMENT AND PLAN     40 year old female presenting to the emergency department for treatment and evaluation after a bat was found living in the vent hood over her kitchen stove. She did not notice any bat droppings, but also states that she just "cleans up and goes" without paying any attention to something like that. She is unsure how long it has been there. She is concerned about rabies and histoplasmosis. Will get chest x-ray and start rabies prophylaxis.    ED COURSE  Chest x-ray negative. Rabies series started. She was advised to go to urgent care for the remainder of rabies series and to follow up with primary care for other viral symptoms.    ___________________________________________   FINAL CLINICAL IMPRESSION(S) / ED DIAGNOSES  Final diagnoses:  Need for prophylactic vaccination against rabies  Acute viral syndrome     ED Discharge Orders     None  SANIAH SCHROETER was evaluated in Emergency Department on 02/11/2021 for the symptoms described in the history of present illness. She was evaluated in the context of the global COVID-19 pandemic, which necessitated consideration that the patient might be at risk for infection with the SARS-CoV-2 virus that causes COVID-19. Institutional protocols and algorithms that pertain to the evaluation of patients at risk for COVID-19 are in a state of rapid change based on information released by regulatory bodies including the CDC and federal and state organizations. These policies and algorithms were followed during the patient's care in the ED.   Note:  This  document was prepared using Dragon voice recognition software and may include unintentional dictation errors.    Chinita Pester, FNP 02/11/21 0044    Sharman Cheek, MD 02/11/21 [phone]

## 2021-02-10 NOTE — Discharge Instructions (Addendum)
Please follow-up with your primary care provider for symptoms that are not improving over the next few days.  I would recommend that you go to [facility] urgent care for the remainder of the rabies series.  You may come here, however be aware that you will likely have a wait.

## 2021-02-10 NOTE — ED Notes (Signed)
Pt calm , collective , denied pain or sob  

## 2021-02-10 NOTE — ED Triage Notes (Signed)
Pt arrived to ed via pov from home with reports of bat exposure. Pt reports bat was in vent over stove and around pots and pans, and food. NAD noted at this time.

## 2021-02-10 NOTE — ED Notes (Addendum)
Pt c/o fatigue, cold symptoms over the past week. Pt sts she saw a bat fall out of the vent above her stove last night.  Denies being bitten that she knows of.  Sts she also donates plasma, and last donation was 5 days ago. No pets in the home.  Denies recent sick contacts.  Pt also notes L posterior thigh pain last week after plasma donation. No swelling present and pain has subsided at this time.

## 2021-02-18 ENCOUNTER — Ambulatory Visit (LOCAL_COMMUNITY_HEALTH_CENTER): Payer: Medicaid Other

## 2021-02-18 ENCOUNTER — Other Ambulatory Visit: Payer: Self-pay

## 2021-02-18 VITALS — BP 112/78 | Ht 68.0 in | Wt 246.5 lb

## 2021-02-18 DIAGNOSIS — Z3202 Encounter for pregnancy test, result negative: Secondary | ICD-10-CM

## 2021-02-18 LAB — PREGNANCY, URINE: Preg Test, Ur: NEGATIVE

## 2021-02-18 NOTE — Progress Notes (Signed)
UPT negative today. LMP 01/05/2021. Regular unprotected sex during past month. Encouraged pt to seek medical evaluation from PCP to evaluate missed periods. May return for preg test next 2 weeks if no period. Advised to abstain from sex for 2 weeks before next urine preg test in order to obtain accurate result. Pt reports plans to contact PCP. Jerel Shepherd, RN

## 2021-03-29 ENCOUNTER — Telehealth: Payer: Self-pay | Admitting: Family Medicine

## 2021-03-29 NOTE — Telephone Encounter (Signed)
Pt states that she started her period this morning, after missing it last month.  She states that she notices "some tissue" and a couple of clots.  She is not having heavy bleeding.  I informed pt to monitor her period and make an appointment if the bleeding is heavier or she notices more clots and tissue. Berdie Ogren

## 2021-04-02 ENCOUNTER — Other Ambulatory Visit: Payer: Self-pay | Admitting: Internal Medicine

## 2021-04-03 LAB — BASIC METABOLIC PANEL WITH GFR
BUN: 8 mg/dL (ref 7–25)
CO2: 25 mmol/L (ref 20–32)
Calcium: 9 mg/dL (ref 8.6–10.2)
Chloride: 107 mmol/L (ref 98–110)
Creat: 0.87 mg/dL (ref 0.50–0.97)
Glucose, Bld: 88 mg/dL (ref 65–99)
Potassium: 4 mmol/L (ref 3.5–5.3)
Sodium: 140 mmol/L (ref 135–146)
eGFR: 88 mL/min/{1.73_m2} (ref >=60)

## 2021-04-03 LAB — HCG, SERUM, QUALITATIVE: Preg, Serum: NEGATIVE

## 2021-04-03 LAB — CBC
HCT: 38 % (ref 35.0–45.0)
Hemoglobin: 12.6 g/dL (ref 11.7–15.5)
MCH: 30.4 pg (ref 27.0–33.0)
MCHC: 33.2 g/dL (ref 32.0–36.0)
MCV: 91.8 fL (ref 80.0–100.0)
MPV: 11.4 fL (ref 7.5–12.5)
Platelets: 310 10*3/uL (ref 140–400)
RBC: 4.14 10*6/uL (ref 3.80–5.10)
RDW: 13.1 % (ref 11.0–15.0)
WBC: 6.7 10*3/uL (ref 3.8–10.8)

## 2021-04-03 LAB — MAGNESIUM: Magnesium: 2.1 mg/dL (ref 1.5–2.5)

## 2021-09-03 ENCOUNTER — Other Ambulatory Visit: Payer: Self-pay | Admitting: Internal Medicine

## 2021-09-04 LAB — COMPLETE METABOLIC PANEL WITH GFR
AG Ratio: 1.5 (calc) (ref 1.0–2.5)
ALT: 12 U/L (ref 6–29)
AST: 16 U/L (ref 10–30)
Albumin: 4.3 g/dL (ref 3.6–5.1)
Alkaline phosphatase (APISO): 79 U/L (ref 31–125)
BUN: 9 mg/dL (ref 7–25)
CO2: 25 mmol/L (ref 20–32)
Calcium: 9.2 mg/dL (ref 8.6–10.2)
Chloride: 106 mmol/L (ref 98–110)
Creat: 0.91 mg/dL (ref 0.50–0.97)
Globulin: 2.8 g/dL (calc) (ref 1.9–3.7)
Glucose, Bld: 78 mg/dL (ref 65–99)
Potassium: 4.2 mmol/L (ref 3.5–5.3)
Sodium: 138 mmol/L (ref 135–146)
Total Bilirubin: 0.3 mg/dL (ref 0.2–1.2)
Total Protein: 7.1 g/dL (ref 6.1–8.1)
eGFR: 83 mL/min/{1.73_m2} (ref >=60)

## 2021-09-04 LAB — CBC
HCT: 37.3 % (ref 35.0–45.0)
Hemoglobin: 12.3 g/dL (ref 11.7–15.5)
MCH: 30.5 pg (ref 27.0–33.0)
MCHC: 33 g/dL (ref 32.0–36.0)
MCV: 92.6 fL (ref 80.0–100.0)
MPV: 11.6 fL (ref 7.5–12.5)
Platelets: 286 10*3/uL (ref 140–400)
RBC: 4.03 10*6/uL (ref 3.80–5.10)
RDW: 12.5 % (ref 11.0–15.0)
WBC: 8.9 10*3/uL (ref 3.8–10.8)

## 2021-09-04 LAB — LIPID PANEL
Cholesterol: 161 mg/dL (ref <200)
HDL: 57 mg/dL (ref >=50)
LDL Cholesterol (Calc): 88 mg/dL (calc)
Non-HDL Cholesterol (Calc): 104 mg/dL (calc) (ref <130)
Total CHOL/HDL Ratio: 2.8 (calc) (ref <5.0)
Triglycerides: 74 mg/dL (ref <150)

## 2021-09-04 LAB — TSH: TSH: 1.11 mIU/L

## 2021-09-04 LAB — VITAMIN D 25 HYDROXY (VIT D DEFICIENCY, FRACTURES): Vit D, 25-Hydroxy: 16 ng/mL — ABNORMAL LOW (ref 30–100)

## 2021-10-25 ENCOUNTER — Ambulatory Visit (INDEPENDENT_AMBULATORY_CARE_PROVIDER_SITE_OTHER): Payer: Managed Care, Other (non HMO)

## 2021-10-25 ENCOUNTER — Other Ambulatory Visit: Payer: Self-pay

## 2021-10-25 ENCOUNTER — Ambulatory Visit (INDEPENDENT_AMBULATORY_CARE_PROVIDER_SITE_OTHER): Payer: Medicaid Other | Admitting: Physician Assistant

## 2021-10-25 ENCOUNTER — Ambulatory Visit: Payer: Self-pay

## 2021-10-25 VITALS — Ht 68.0 in | Wt 240.0 lb

## 2021-10-25 DIAGNOSIS — M25561 Pain in right knee: Secondary | ICD-10-CM

## 2021-10-25 DIAGNOSIS — G8929 Other chronic pain: Secondary | ICD-10-CM

## 2021-10-25 DIAGNOSIS — M25562 Pain in left knee: Secondary | ICD-10-CM

## 2021-10-25 NOTE — Progress Notes (Signed)
? ?Office Visit Note ?  ?Patient: Paula Shelton           ?Date of Birth: 1980/10/22           ?MRN: [id number] ?Visit Date: 10/25/2021 ?             ?Requested by: Levonne Lapping, NP ?[address] ?[place],  [place] [address] ?PCP: Levonne Lapping, NP ? ?Chief Complaint  ?Patient presents with  ? Left Knee - Pain  ? Right Knee - Pain  ? ? ? ? ?HPI: ?Patient is a pleasant 41 year old woman with a lifetime history of anterior knee pain.  She denies any specific injury.  Left is worse than right.  She does denies any instability.  She says she is always had a bump on the front of her knee.  She points to the pain bilaterally at the insertion of the patellar tendon into the tibial tubercle.  She says the knee is also popping crack.  A sending or descending stairs is particularly difficult ? ?Assessment & Plan: ?Visit Diagnoses:  ?1. Chronic pain of both knees   ? ? ?Plan: X-rays did not show any acute injury she does have ossifications off the distal patella into the tibial tubercle on the left side.  Consistent with [employer] as a child.  Talked about physical therapy for quadricep strengthening.  Have also shown her some quadricep close chain exercises.  Talked about Voltaren in the areas that are sore.  Also given her prescription for meloxicam.  We will follow-up with me as needed ? ?Follow-Up Instructions: No follow-ups on file.  ? ?Ortho Exam ? ?Patient is alert, oriented, no adenopathy, well-dressed, normal affect, normal respiratory effort. ?Bilateral knees no effusion no cellulitis no redness.  No tenderness on the medial lateral joint lines.  She does have pain which is provoked with resisted extension of her leg though strength is good.  She is tenderness is over the distal patellar tendon insertion into the tibial tubercle.  She has significant grinding in the patellofemoral joint with range of motion ? ?Imaging: ?No results found. ?No images are attached to the encounter. ? ?Labs: ?No results  found for: HGBA1C, ESRSEDRATE, CRP, LABURIC, REPTSTATUS, GRAMSTAIN, CULT, LABORGA ? ? ?Lab Results  ?Component Value Date  ? ALBUMIN 4.2 12/19/2020  ? ALBUMIN 4.2 12/10/2020  ? ALBUMIN 3.5 03/06/2019  ? ? ?Lab Results  ?Component Value Date  ? MG 2.1 04/02/2021  ? ?Lab Results  ?Component Value Date  ? VD25OH 16 (L) 09/03/2021  ? ? ?No results found for: PREALBUMIN ?CBC EXTENDED Latest Ref Rng & Units 09/03/2021 04/02/2021 12/19/2020  ?WBC 3.8 - 10.8 Thousand/uL 8.9 6.7 8.6  ?RBC 3.80 - 5.10 Million/uL 4.03 4.14 3.95  ?HGB 11.7 - 15.5 g/dL 10.2 58.5 11.4(L)  ?HCT 35.0 - 45.0 % 37.3 38.0 35.6(L)  ?PLT 140 - 400 Thousand/uL 286 310 290  ?NEUTROABS 1.7 - 7.7 K/uL - - 5.8  ?LYMPHSABS 0.7 - 4.0 K/uL - - 1.8  ? ? ? ?Body mass index is 36.49 kg/m?. ? ?Orders:  ?Orders Placed This Encounter  ?Procedures  ? XR Knee 1-2 Views Right  ? XR Knee 1-2 Views Left  ? ?No orders of the defined types were placed in this encounter. ? ? ? Procedures: ?No procedures performed ? ?Clinical Data: ?No additional findings. ? ?ROS: ? ?All other systems negative, except as noted in the HPI. ?Review of Systems ? ?Objective: ?Vital Signs: Ht 5\' 8"  (1.727 m)  Wt 240 lb (108.9 kg)   LMP 11/04/2020   BMI 36.49 kg/m?  ? ?Specialty Comments:  ?No specialty comments available. ? ?PMFS History: ?Patient Active Problem List  ? Diagnosis Date Noted  ? History of COVID-19 09/26/2019  ? Bipolar 1 disorder (HCC) 04/01/2019  ? History of posttraumatic stress disorder (PTSD) 05/16/2017  ? Mixed anxiety and depressive disorder 05/16/2017  ? 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 History of major depression 01/21/2017  ? ?Past Medical History:  ?Diagnosis Date  ? Depression   ? Migraines   ?  ?Family History  ?Problem Relation Age of Onset  ? Breast cancer Mother   ? Depression Mother   ? Diabetes Mother   ? Hypertension Mother   ? Migraines Mother   ? Hypertension Father   ? Migraines Father   ?  ?Past Surgical History:  ?Procedure Laterality Date   ? ADRENALECTOMY  2018  ? benign  ? CHOLECYSTECTOMY    ? FOOT SURGERY    ? TOE SURGERY    ? ?Social History  ? ?Occupational History  ? Not on file  ?Tobacco Use  ? Smoking status: Every Day  ?  Packs/day: 0.50  ?  Types: Cigarettes  ? Smokeless tobacco: Never  ?Vaping Use  ? Vaping Use: Never used  ?Substance and Sexual Activity  ? Alcohol use: No  ? Drug use: No  ? Sexual activity: Yes  ?  Birth control/protection: None  ?  Comment: hx implant - headaches, removed 2020  ? ? ? ? ? ?

## 2021-11-30 ENCOUNTER — Encounter: Payer: Self-pay | Admitting: Gastroenterology

## 2021-11-30 ENCOUNTER — Encounter (HOSPITAL_COMMUNITY): Payer: Self-pay | Admitting: Emergency Medicine

## 2021-11-30 ENCOUNTER — Emergency Department (HOSPITAL_COMMUNITY): Payer: Managed Care, Other (non HMO)

## 2021-11-30 ENCOUNTER — Emergency Department (HOSPITAL_COMMUNITY)
Admission: EM | Admit: 2021-11-30 | Discharge: 2021-11-30 | Disposition: A | Discharge status: Home / Self Care | Payer: Managed Care, Other (non HMO) | Attending: Emergency Medicine | Admitting: Emergency Medicine

## 2021-11-30 ENCOUNTER — Other Ambulatory Visit: Payer: Self-pay

## 2021-11-30 DIAGNOSIS — Z20822 Contact with and (suspected) exposure to covid-19: Secondary | ICD-10-CM | POA: Diagnosis not present

## 2021-11-30 DIAGNOSIS — M549 Dorsalgia, unspecified: Secondary | ICD-10-CM | POA: Insufficient documentation

## 2021-11-30 DIAGNOSIS — R0781 Pleurodynia: Secondary | ICD-10-CM | POA: Insufficient documentation

## 2021-11-30 DIAGNOSIS — R519 Headache, unspecified: Secondary | ICD-10-CM | POA: Insufficient documentation

## 2021-11-30 DIAGNOSIS — R0602 Shortness of breath: Secondary | ICD-10-CM | POA: Diagnosis present

## 2021-11-30 DIAGNOSIS — R059 Cough, unspecified: Secondary | ICD-10-CM | POA: Insufficient documentation

## 2021-11-30 LAB — D-DIMER, QUANTITATIVE: D-Dimer, Quant: 0.33 ug/mL-FEU (ref 0.00–0.50)

## 2021-11-30 LAB — CBC WITH DIFFERENTIAL/PLATELET
Abs Immature Granulocytes: 0.01 10*3/uL (ref 0.00–0.07)
Basophils Absolute: 0 10*3/uL (ref 0.0–0.1)
Basophils Relative: 1 %
Eosinophils Absolute: 0.2 10*3/uL (ref 0.0–0.5)
Eosinophils Relative: 2 %
HCT: 38.7 % (ref 36.0–46.0)
Hemoglobin: 12.6 g/dL (ref 12.0–15.0)
Immature Granulocytes: 0 %
Lymphocytes Relative: 14 %
Lymphs Abs: 1.2 10*3/uL (ref 0.7–4.0)
MCH: 31 pg (ref 26.0–34.0)
MCHC: 32.6 g/dL (ref 30.0–36.0)
MCV: 95.3 fL (ref 80.0–100.0)
Monocytes Absolute: 0.7 10*3/uL (ref 0.1–1.0)
Monocytes Relative: 8 %
Neutro Abs: 6.2 10*3/uL (ref 1.7–7.7)
Neutrophils Relative %: 75 %
Platelets: 275 10*3/uL (ref 150–400)
RBC: 4.06 MIL/uL (ref 3.87–5.11)
RDW: 12.7 % (ref 11.5–15.5)
WBC: 8.2 10*3/uL (ref 4.0–10.5)
nRBC: 0 % (ref 0.0–0.2)

## 2021-11-30 LAB — RESP PANEL BY RT-PCR (FLU A&B, COVID) ARPGX2
Influenza A by PCR: NEGATIVE
Influenza B by PCR: NEGATIVE
SARS Coronavirus 2 by RT PCR: NEGATIVE

## 2021-11-30 LAB — BASIC METABOLIC PANEL
Anion gap: 5 (ref 5–15)
BUN: 7 mg/dL (ref 6–20)
CO2: 24 mmol/L (ref 22–32)
Calcium: 8.9 mg/dL (ref 8.9–10.3)
Chloride: 110 mmol/L (ref 98–111)
Creatinine, Ser: 0.91 mg/dL (ref 0.44–1.00)
GFR, Estimated: >60 mL/min (ref >60)
Glucose, Bld: 106 mg/dL — ABNORMAL HIGH (ref 70–99)
Potassium: 3.5 mmol/L (ref 3.5–5.1)
Sodium: 139 mmol/L (ref 135–145)

## 2021-11-30 LAB — I-STAT BETA HCG BLOOD, ED (MC, WL, AP ONLY): I-stat hCG, quantitative: <5 m[IU]/mL (ref <5)

## 2021-11-30 LAB — TROPONIN I (HIGH SENSITIVITY): Troponin I (High Sensitivity): <2 ng/L (ref <18)

## 2021-11-30 MED ORDER — KETOROLAC TROMETHAMINE 60 MG/2ML IM SOLN
30.0000 mg | Freq: Once | INTRAMUSCULAR | Status: AC
  Administered 2021-11-30: 30 mg via INTRAMUSCULAR
  Filled 2021-11-30: qty 2

## 2021-11-30 MED ORDER — ALBUTEROL SULFATE HFA 108 (90 BASE) MCG/ACT IN AERS: 2.0000 | INHALATION_SPRAY | RESPIRATORY_TRACT | Status: DC | PRN

## 2021-11-30 NOTE — ED Provider Triage Note (Signed)
Emergency Medicine Provider Triage Evaluation Note ? ?Paula Shelton , a 42 y.o. female  was evaluated in triage.  Pt complains of pleuritic type left-sided back pain and left chest pain that began yesterday.  Patient also complains of some shortness of breath.  She states she has had a mild cough.  She also complains of a mild headache.  Denies any fevers or chills and no recent sick contacts.  Nuys any history of DVT or PE.  No recent prolonged travel or immobilization.  No hemoptysis.  No active malignancy.  No exogenous hormone use. ? ?Review of Systems  ?Positive: + chest pain, back pain, cough, SOB, headache ?Negative: - fevers, chills, body aches, sore throat, congestion, rhinorrhea ? ?Physical Exam  ?BP 128/86 (BP Location: Left Arm)   Pulse 88   Temp 98.9 ?F (37.2 ?C) (Oral)   Resp 18   Ht 5\' 8"  (1.727 m)   Wt 106.6 kg   LMP 11/04/2021   SpO2 100%   BMI 35.73 kg/m?  ?Gen:   Awake, no distress   ?Resp:  Normal effort  ?MSK:   Moves extremities without difficulty  ?Other:  No chest wall or thoracic TTP. Able to speak in full sentences without difficulty. Satting 100% on RA.  ? ?Medical Decision Making  ?Medically screening exam initiated at 12:50 PM.  Appropriate orders placed.  Paula Shelton was informed that the remainder of the evaluation will be completed by another provider, this initial triage assessment does not replace that evaluation, and the importance of remaining in the ED until their evaluation is complete. ? ? ?  ?Paula Standard, PA-C ?11/30/21 1252 ? ?

## 2021-11-30 NOTE — Discharge Instructions (Signed)
Your workup was overall reassuring in the ED today. It is recommended that you take 800 mg Ibuprofen (4 OTC tablets) every 8 hours and 1,000 mg Tylenol (2 OTC double strength tablets) every 8 hours as needed for pain. You can alternate these medications every 4 hours to stay on top of your pain.  ? ?Follow up with your PCP for further evaluation of your symptoms ? ?Return to the ED IMMEDIATELY for any new/worsening symptoms ?

## 2021-11-30 NOTE — ED Triage Notes (Signed)
Per pt, states she is SOB and pain with inhalation-symptoms started 2 days ago-no oral birth control ?

## 2021-11-30 NOTE — ED Provider Notes (Signed)
?[facility]-EMERGENCY DEPT ?Provider Note ? ? ?CSN: [id number] ?Arrival date & time: 11/30/21  1224 ? ?  ? ?History ? ?Chief Complaint  ?Patient presents with  ? Shortness of Breath  ? ? ?Paula StandardShawnette R Shelton is a [age] y.o. female who presents to the ED today with complaint of gradual onset, constant, pleuritic type left-sided back pain and left chest pain that began yesterday.  Patient also complains of some shortness of breath.  She states she has had a mild cough.  She also complains of a mild headache.  Denies any fevers or chills and no recent sick contacts.  Denies any history of DVT or PE.  No recent prolonged travel or immobilization.  No hemoptysis.  No active malignancy.  No exogenous hormone use. Pt is an everyday smoker.  ? ?The history is provided by the patient and medical records.  ? ?  ? ?Home Medications ?Prior to Admission medications   ?Medication Sig Start Date End Date Taking? Authorizing Provider  ?Multiple Vitamin (MULTIVITAMIN WITH MINERALS) TABS tablet Take 1 tablet by mouth daily.    [provider]  ?omeprazole (PRILOSEC) 20 MG capsule Take 20 mg by mouth daily. Pt reports does not take it every day    [provider]  ?etonogestrel-ethinyl estradiol (NUVARING) 0.12-0.015 MG/24HR vaginal ring Insert vaginally and leave in place for 3 consecutive weeks, then remove for 1 week. 09/21/20 12/10/20  Constant, Peggy, MD  ?fluticasone (FLONASE) 50 MCG/ACT nasal spray Place 2 sprays into both nostrils daily. ?Patient not taking: Reported on 06/30/2020 08/13/19 12/10/20  Jeanie SewerFawze, Mina A, PA-C  ?SUMAtriptan (IMITREX) 100 MG tablet Take 1 tablet (100 mg total) by mouth every 2 (two) hours as needed for migraine. May repeat in 2 hours if headache persists or recurs. ?Patient not taking: Reported on 08/17/2020 03/07/19 12/10/20  Frederik PearMcDonald, Mia A, PA-C  ?   ? ?Allergies    ?Keflex [cephalexin] and Percocet [oxycodone-acetaminophen]   ? ?Review of Systems   ?Review of Systems   ?Constitutional:  Negative for chills, diaphoresis and fever.  ?HENT:  Negative for ear pain and sore throat.   ?Respiratory:  Positive for cough and shortness of breath.   ?Cardiovascular:  Positive for chest pain.  ?Gastrointestinal:  Negative for nausea and vomiting.  ?Musculoskeletal:  Positive for back pain. Negative for myalgias.  ?All other systems reviewed and are negative. ? ?Physical Exam ?Updated Vital Signs ?BP 130/81   Pulse 80   Temp 98.9 ?F (37.2 ?C) (Oral)   Resp 18   Ht 5\' 8"  (1.727 m)   Wt 106.6 kg   LMP 11/04/2021   SpO2 100%   BMI 35.73 kg/m?  ? ?Physical Exam ?Vitals and nursing note reviewed.  ?Constitutional:   ?   Appearance: She is not ill-appearing or diaphoretic.  ?HENT:  ?   Head: Normocephalic and atraumatic.  ?Eyes:  ?   Conjunctiva/sclera: Conjunctivae normal.  ?Cardiovascular:  ?   Rate and Rhythm: Normal rate and regular rhythm.  ?   Pulses: Normal pulses.  ?Pulmonary:  ?   Effort: Pulmonary effort is normal.  ?   Breath sounds: Normal breath sounds. No decreased breath sounds, wheezing, rhonchi or rales.  ?   Comments: Speaking in full sentences without difficulty. Satting 100% on RA. LCTAB.  ?Chest:  ?   Chest wall: No tenderness.  ?Abdominal:  ?   Palpations: Abdomen is soft.  ?   Tenderness: There is no abdominal tenderness.  ?Musculoskeletal:  ?  Cervical back: Neck supple.  ?   Right lower leg: No edema.  ?   Left lower leg: No edema.  ?   Comments: No obvious thoracic back TTP where patient points to her pain  ?Skin: ?   General: Skin is warm and dry.  ?Neurological:  ?   Mental Status: She is alert.  ? ? ?ED Results / Procedures / Treatments   ?Labs ?(all labs ordered are listed, but only abnormal results are displayed) ?Labs Reviewed  ?BASIC METABOLIC PANEL - Abnormal; Notable for the following components:  ?    Result Value  ? Glucose, Bld 106 (*)   ? All other components within normal limits  ?RESP PANEL BY RT-PCR (FLU A&B, COVID) ARPGX2  ?CBC WITH  DIFFERENTIAL/PLATELET  ?D-DIMER, QUANTITATIVE  ?I-STAT BETA HCG BLOOD, ED ([facility], [facility], [facility] ONLY)  ?TROPONIN I (HIGH SENSITIVITY)  ? ? ?EKG ?None ? ?Radiology ?DG Chest 2 View ? ?Result Date: 11/30/2021 ?CLINICAL DATA:  Shortness of breath with left-sided chest pain for 2 days. EXAM: CHEST - 2 VIEW COMPARISON:  Radiographs 02/10/2021 and 07/14/2019. Chest CTA 07/15/2019. FINDINGS: The heart size and mediastinal contours are normal. The lungs are clear. There is no pleural effusion or pneumothorax. No acute osseous findings are identified. IMPRESSION: Stable chest.  No active cardiopulmonary process. Electronically Signed   By: Carey Bullocks M.D.   On: 11/30/2021 13:40   ? ?Procedures ?Procedures  ? ? ?Medications Ordered in ED ?Medications  ?albuterol (VENTOLIN HFA) 108 (90 Base) MCG/ACT inhaler 2 puff (has no administration in time range)  ?ketorolac (TORADOL) injection 30 mg (30 mg Intramuscular Given 11/30/21 1928)  ? ? ?ED Course/ Medical Decision Making/ A&P ?  ?                        ?Medical Decision Making ?41 year old female who presents to the ED today with complaints of pleuritic type chest pain back pain began yesterday.  On arrival to the ED today vitals are stable.  Patient afebrile, nontachycardic and nontachypneic.  She was medically screened by myself.  No obvious chest wall or thoracic back tenderness palpation.  Patient reports that it was deeper inside.  Work-up was started including EKG, chest x-ray, CBC, BMP, i-STAT beta-hCG, troponin as well as D-dimer.  There is no risk factors for PE besides describing pleuritic type pain.  Unable to [facility] out due to same.  A breathing treatment was ordered by triage nurse however no wheezing on exam and no history of asthma. ? ?EKG unchanged from previous. ?Chest x-ray clear. ?CBC without leukocytosis.  Hemoglobin stable at 12.6 without evidence of anemia. ?BMP without electrolyte abnormality ?Troponin less than 2.  Very low suspicion for ACS.  Do not feel  patient requires additional repeat testing at this time. ?Dimer 0.33.  She is otherwise low risk for PE with Well's score.  I do not feel she requires CTA at this time. ?COVID and flu are negative today. ? ?Work-up reassuring at this time.  Question musculoskeletal type pain.  We will plan for IM Toradol injection and reevaluation.  ? ?On reeval pt reports improvement in symptoms. Have recommend alternating Ibuprofen and Tylenol and PCP follow up. Pt instructed on strict return precautions. Pt in agreement with plan and stable for discharge home.  ? ?Amount and/or Complexity of Data Reviewed ?Labs: ordered. ?Radiology: ordered. ? ?Risk ?Prescription drug management. ? ? ? ? ? ? ? ? ? ?Final Clinical Impression(s) /  ED Diagnoses ?Final diagnoses:  ?Shortness of breath  ? ? ?Rx / DC Orders ?ED Discharge Orders   ? ? None  ? ?  ? ? ? ?Discharge Instructions   ? ?  ?Your workup was overall reassuring in the ED today. It is recommended that you take 800 mg Ibuprofen (4 OTC tablets) every 8 hours and 1,000 mg Tylenol (2 OTC double strength tablets) every 8 hours as needed for pain. You can alternate these medications every 4 hours to stay on top of your pain.  ? ?Follow up with your PCP for further evaluation of your symptoms ? ?Return to the ED IMMEDIATELY for any new/worsening symptoms ? ? ? ? ?  ?Tanda Rockers, PA-C ?11/30/21 2009 ? ?  ?Linwood Dibbles, MD ?11/30/21 2340 ? ?

## 2021-12-07 ENCOUNTER — Encounter: Payer: Self-pay | Admitting: Gastroenterology

## 2021-12-07 ENCOUNTER — Ambulatory Visit: Payer: Medicaid Other | Admitting: Gastroenterology

## 2021-12-07 VITALS — BP 110/64 | HR 77 | Ht 68.5 in | Wt 235.2 lb

## 2021-12-07 DIAGNOSIS — R109 Unspecified abdominal pain: Secondary | ICD-10-CM | POA: Diagnosis not present

## 2021-12-07 DIAGNOSIS — R1011 Right upper quadrant pain: Secondary | ICD-10-CM | POA: Diagnosis not present

## 2021-12-07 DIAGNOSIS — R194 Change in bowel habit: Secondary | ICD-10-CM
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
## 2021-12-07 NOTE — Patient Instructions (Addendum)
It was my pleasure to provide care to you today. Based on our discussion, I am providing you with my recommendations below: ? ?RECOMMENDATION(S):  ? ?Add a daily dose of Metamucil or Benefiber. ? ?PRESCRIPTION MEDICATION(S):  ? ?We have sent the following medication(s) to your pharmacy: ? ?Desipramine 10 mg nightly, may increase to 20 mg after 4 weeks if no improvement.  ? ?IMAGING: ? ?You have been scheduled for a CT scan of the abdomen and pelvis at [facility], 1st floor Radiology. You are scheduled on Friday 12/17/21  at 8:30 am. You should arrive 15 minutes prior to your appointment time for registration.  Please follow the written instructions below on the day of your exam:  ? ?1) Do not eat anything after 4:30 am (4 hours prior to your test)  ? ?2) Drink 1 bottle of contrast @ 6:30 am (2 hours prior to your exam)  Remember to shake well before drinking and do NOT pour over ice. ?    Drink 1 bottle of contrast @ 7:30 am (1 hour prior to your exam)  ? ?You may take any medications as prescribed with a small amount of water, if necessary. If you take any of the following medications: METFORMIN, GLUCOPHAGE, GLUCOVANCE, AVANDAMET, RIOMET, FORTAMET, ACTOPLUS MET, JANUMET, [place] or METAGLIP, you MAY be asked to HOLD this medication 48 hours AFTER the exam.  ? ?The purpose of you drinking the oral contrast is to aid in the visualization of your intestinal tract. The contrast solution may cause some diarrhea. Depending on your individual set of symptoms, you may also receive an intravenous injection of x-ray contrast/dye. Plan on being at [facility] for 45 minutes or longer, depending on the type of exam you are having performed.  ? ?If you have any questions regarding your exam or if you need to reschedule, you may call [facility] at [phone] between the hours of 8:00 am and 5:00 pm, Monday-Friday.  ? ?FOLLOW UP: ? ?I would like for you to follow up with me in 8 weeks . Please call the  office at [phone] to schedule your appointment. ? ?BMI: ? ?If you are age 39 or younger, your body mass index should be between 19-25. Your Body mass index is 35.25 kg/m?[name] If this is out of the aformentioned range listed, please consider follow up with your Primary Care Provider.  ? ?MY CHART: ? ?The [facility] providers would like to encourage you to use [facility] to communicate with providers for non-urgent requests or questions.  Due to long hold times on the telephone, sending your provider a message by [facility] may be a faster and more efficient way to get a response.  Please allow 48 business hours for a response.  Please remember that this is for non-urgent requests.  ? ?Thank you for trusting me with your gastrointestinal care!   ? ?Thornton Park, MD, MPH ? ?

## 2021-12-17 ENCOUNTER — Ambulatory Visit (HOSPITAL_COMMUNITY): Payer: Medicaid Other

## 2021-12-20 ENCOUNTER — Telehealth: Payer: Self-pay

## 2021-12-20 NOTE — Telephone Encounter (Signed)
Following secure chat message was received by Radiology: ? ?Pt was scheduled for ct a/p w contrast on 12/17/21, no show and not rescheduled at this time. thank you. ct [place] ? ?Routing this message to Dr. Tarri Glenn CMA. ?

## 2021-12-23 NOTE — Telephone Encounter (Signed)
Patient apologized for missing CT scan. She forgot about her appointment. Sent mychart message with information for patient to reschedule appointment.  ?

## 2022-01-27 ENCOUNTER — Telehealth: Payer: Self-pay | Admitting: *Deleted

## 2022-01-27 ENCOUNTER — Ambulatory Visit (HOSPITAL_COMMUNITY): Payer: Medicaid Other | Attending: Gastroenterology

## 2022-01-27 NOTE — Telephone Encounter (Signed)
Following secure chat message was received by Radiology:  pt: Paula Shelton, dob 1981/02/06 was scheduled today 01/27/22 for ct a/p w contrast, no show and not rescheduled at this time. thank you - vanda - [place] ct

## 2022-02-03 ENCOUNTER — Ambulatory Visit: Payer: Medicaid Other | Admitting: Gastroenterology

## 2022-02-03 NOTE — Progress Notes (Deleted)
Referring Provider: Levonne Lapping, NP Primary Care Physician:  Levonne Lapping, NP  Reason for Consultation: Adrenal mass with right upper quadrant pain and flank pain   IMPRESSION:  Abdominal pain x > 15 years, developing at age 41    - pain not relieved with cholecystectomy    - previously evaluated by EGD and colonoscopy at [facility]    - requiring multiple ED visits Suspected adhesions given extensive evaluation in the past Alternating bowel habits Prior cholecystectomy 2011 Prior right adrenalectomy 2019  Although I suspect irritable bowel syndrome given the chronicity of symptoms, I think there is a component of adhesive disease contributing. There is are no alarm features. There is no family history of colon polyps, colon cancer, or celiac disease.   If is unclear if she has been evaluated for IBS masqueraders including celiac disease, food intolerance (lactose, fructose, sucrose), SIBO, thyroid disorder.    PLAN: CT abd/pelvis with contrast Add a daily dose of Metamucil or Benefiber Trial of Desipramine 10 mg QHS, increase to 20 mg QHS after 4 weeks if no improvement Returns in 8 weeks, earlier if needed Consider a trial of Xifaxan at that time if symptoms persist  HPI: Paula Shelton is a 41 y.o. female initially seen 12/07/2021 when she was referred by NP Johny Drilling for further evaluation of an adrenal mass, right upper quadrant and flank pain.  The interval history is obtained through the patient and review of her electronic health record.  She has had a prior cholecystectomy for gallstones 2011 and removal of an adrenal tumor 2018. She works as a Conservation officer, nature.  She reports years of intermittent, recurrent, crampy RUQ abdominal pain that started at age 74.   Removal of the gallbladder in 2011 did not improve her pain but she developed post-prandial diarrhea.   Severe escalation of pain. 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  Pain has not improved.  Has had continued pain at the surgical site and in the right upper quadrant since that time.  It is a deep internal pain that cannot be reproduced on abdominal exam.  There is some improvement after defecation. She also reports alternating diarrhea and constipation.  A colonoscopy was performed with Dr. Bernadette Hoit at [facility] 06/20/2018 for right upper quadrant abdominal pain and diarrhea of unclear etiology.  The colonoscopy was performed to the terminal ileum and was normal except for internal hemorrhoids.  Random biopsies were obtained.  EGD performed with Dr. Bernadette Hoit 06/20/2018 for right upper quadrant abdominal pain was normal.  Gastric biopsies were obtained. A trial of Levson was not helpful.   She had required multiple ER visits over the years.   There is a pain up under the right rib cage described as a aching pain. Feels like something is there. It only occurs at night. Only improved after defecation. She will frequently go days between bowel movements. Movement worsens the pain. No changes with eating.  She feels like food is building up in the right side and escalates until she has a bowel movement.   Alternating bowel habits.  Pain is not associated with po intake, nausea, vomiting, fevers, chills, or weight loss.   No improvement with increased water, exercise, Tylenol, ibuprofen. She does not like narcotics because she doesn't want to due drugs.   She does not think that she's had any abdominal imaging to evaluate the pain since her last surgery in 2019.   CT scan recommended at  the time of her last office visit but she no showed for the appointment.  There is no known family history of colon cancer or polyps. No family history of stomach cancer or other GI malignancy. No family history of inflammatory bowel disease or celiac.    Past Medical History:  Diagnosis Date   Depression    Migraines     Past Surgical History:  Procedure Laterality Date    ADRENALECTOMY  2018   benign   CHOLECYSTECTOMY     FOOT TENDON SURGERY Left    knife fell on foot   TOE SURGERY Bilateral    toe shortening surgery    Current Outpatient Medications  Medication Sig Dispense Refill   Multiple Vitamin (MULTIVITAMIN WITH MINERALS) TABS tablet Take 1 tablet by mouth daily.     omeprazole (PRILOSEC) 20 MG capsule Take 20 mg by mouth as needed.     No current facility-administered medications for this visit.       Physical Exam: General:   Alert,  well-nourished, pleasant and cooperative in NAD Head:  Normocephalic and atraumatic. Eyes:  Sclera clear, no icterus.   Conjunctiva pink. Abdomen:  Soft, nontender, nondistended, normal bowel sounds, no rebound or guarding. No hepatosplenomegaly.   Neurologic:  Alert and  oriented x4;  grossly nonfocal Skin:  Intact without significant lesions or rashes. Psych:  Alert and cooperative. Normal mood and affect.    Fermon Ureta L. Orvan Falconer, MD, MPH 02/03/2022, 1:54 PM

## 2022-12-07 ENCOUNTER — Ambulatory Visit (LOCAL_COMMUNITY_HEALTH_CENTER): Payer: 59

## 2022-12-07 VITALS — BP 113/77 | Ht 68.0 in | Wt 239.5 lb

## 2022-12-07 DIAGNOSIS — Z3202 Encounter for pregnancy test, result negative: Secondary | ICD-10-CM | POA: Diagnosis not present

## 2022-12-07 DIAGNOSIS — Z3009 Encounter for other general counseling and advice on contraception: Secondary | ICD-10-CM

## 2022-12-07 LAB — PREGNANCY, URINE: Preg Test, Ur: NEGATIVE

## 2022-12-07 NOTE — Progress Notes (Addendum)
UPT negative. Recalls LMP 10/28/2022. Last sex 4/10, 4/11, 4/12. No bc method currently.  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 Has PCP at [facility] and has Rx from them to pick up nuvaring today.   Consult Aliene Altes, FNP who advises patient to abstain from sex for next 2 days and to do a home preg test Friday (from local store)  and if negative, patient may start nuvaring.   RN explained provider recommendations. Patient in agreement. Negative preg test packet given and reviewed with patient. [facility] services and local resources discussed with patient. Questions answered and reports understanding. Jerel Shepherd, RN

## 2024-01-27 ENCOUNTER — Ambulatory Visit (INDEPENDENT_AMBULATORY_CARE_PROVIDER_SITE_OTHER)

## 2024-01-27 ENCOUNTER — Ambulatory Visit (HOSPITAL_COMMUNITY)
Admission: EM | Admit: 2024-01-27 | Discharge: 2024-01-27 | Disposition: A | Discharge status: Home / Self Care | Attending: Internal Medicine | Admitting: Internal Medicine

## 2024-01-27 ENCOUNTER — Encounter (HOSPITAL_COMMUNITY): Payer: Self-pay | Admitting: Emergency Medicine

## 2024-01-27 DIAGNOSIS — M545 Low back pain, unspecified: Secondary | ICD-10-CM

## 2024-01-27 DIAGNOSIS — M62830 Muscle spasm of back: Secondary | ICD-10-CM | POA: Diagnosis not present

## 2024-01-27 DIAGNOSIS — M79671 Pain in right foot: Secondary | ICD-10-CM

## 2024-01-27 DIAGNOSIS — M25531 Pain in right wrist: Secondary | ICD-10-CM

## 2024-01-27 DIAGNOSIS — S39012A Strain of muscle, fascia and tendon of lower back, initial encounter: Secondary | ICD-10-CM | POA: Diagnosis not present

## 2024-01-27 MED ORDER — KETOROLAC TROMETHAMINE 30 MG/ML IJ SOLN
30.0000 mg | Freq: Once | INTRAMUSCULAR | Status: AC
  Administered 2024-01-27: 30 mg via INTRAMUSCULAR

## 2024-01-27 MED ORDER — PREDNISONE 20 MG PO TABS: 40.0000 mg | ORAL_TABLET | Freq: Every day | ORAL | 0 refills | Status: AC

## 2024-01-27 MED ORDER — CYCLOBENZAPRINE HCL 5 MG PO TABS
5.0000 mg | ORAL_TABLET | Freq: Three times a day (TID) | ORAL | 0 refills | Status: DC | PRN
Start: 2024-01-27 — End: 2024-03-26

## 2024-01-27 MED ORDER — DEXAMETHASONE SODIUM PHOSPHATE 10 MG/ML IJ SOLN
10.0000 mg | Freq: Once | INTRAMUSCULAR | Status: AC
  Administered 2024-01-27: 10 mg via INTRAMUSCULAR

## 2024-01-27 MED ORDER — KETOROLAC TROMETHAMINE 30 MG/ML IJ SOLN
INTRAMUSCULAR | Status: AC
  Filled 2024-01-27: qty 1

## 2024-01-27 MED ORDER — DEXAMETHASONE SODIUM PHOSPHATE 10 MG/ML IJ SOLN
INTRAMUSCULAR | Status: AC
  Filled 2024-01-27: qty 1
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
## 2024-02-04 ENCOUNTER — Ambulatory Visit: Payer: Self-pay | Admitting: Internal Medicine

## 2024-03-26 ENCOUNTER — Other Ambulatory Visit: Payer: Self-pay

## 2024-03-26 ENCOUNTER — Emergency Department
Admission: EM | Admit: 2024-03-26 | Discharge: 2024-03-26 | Disposition: A | Discharge status: Home / Self Care | Attending: Emergency Medicine | Admitting: Emergency Medicine

## 2024-03-26 ENCOUNTER — Emergency Department

## 2024-03-26 DIAGNOSIS — Y92410 Unspecified street and highway as the place of occurrence of the external cause: Secondary | ICD-10-CM | POA: Diagnosis not present

## 2024-03-26 DIAGNOSIS — S39012A Strain of muscle, fascia and tendon of lower back, initial encounter: Secondary | ICD-10-CM | POA: Diagnosis not present

## 2024-03-26 DIAGNOSIS — M542 Cervicalgia: Secondary | ICD-10-CM | POA: Insufficient documentation

## 2024-03-26 DIAGNOSIS — M545 Low back pain, unspecified: Secondary | ICD-10-CM | POA: Diagnosis present

## 2024-03-26 DIAGNOSIS — R519 Headache, unspecified: Secondary | ICD-10-CM | POA: Diagnosis not present

## 2024-03-26 MED ORDER — LIDOCAINE 5 % EX PTCH: 1.0000 | MEDICATED_PATCH | Freq: Two times a day (BID) | CUTANEOUS | 0 refills | Status: AC

## 2024-03-26 MED ORDER — CYCLOBENZAPRINE HCL 5 MG PO TABS: 5.0000 mg | ORAL_TABLET | Freq: Three times a day (TID) | ORAL | 0 refills | Status: DC | PRN
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
## 2024-04-01 ENCOUNTER — Other Ambulatory Visit: Payer: Self-pay

## 2024-04-01 ENCOUNTER — Encounter (HOSPITAL_COMMUNITY): Payer: Self-pay | Admitting: Emergency Medicine

## 2024-04-01 ENCOUNTER — Ambulatory Visit (HOSPITAL_COMMUNITY)
Admission: EM | Admit: 2024-04-01 | Discharge: 2024-04-01 | Disposition: A | Discharge status: Home / Self Care | Attending: Internal Medicine | Admitting: Internal Medicine

## 2024-04-01 DIAGNOSIS — G44309 Post-traumatic headache, unspecified, not intractable: Secondary | ICD-10-CM | POA: Diagnosis not present

## 2024-04-01 DIAGNOSIS — M545 Low back pain, unspecified: Secondary | ICD-10-CM | POA: Diagnosis not present

## 2024-04-01 DIAGNOSIS — S46811A Strain of other muscles, fascia and tendons at shoulder and upper arm level, right arm, initial encounter: Secondary | ICD-10-CM

## 2024-04-01 DIAGNOSIS — S199XXA Unspecified injury of neck, initial encounter: Secondary | ICD-10-CM | POA: Diagnosis not present

## 2024-04-01 DIAGNOSIS — S0990XA Unspecified injury of head, initial encounter: Secondary | ICD-10-CM

## 2024-04-01 DIAGNOSIS — M5441 Lumbago with sciatica, right side: Secondary | ICD-10-CM | POA: Diagnosis not present

## 2024-04-01 MED ORDER — KETOROLAC TROMETHAMINE 30 MG/ML IJ SOLN
INTRAMUSCULAR | Status: AC
  Filled 2024-04-01: qty 1

## 2024-04-01 MED ORDER — PREDNISONE 20 MG PO TABS: 40.0000 mg | ORAL_TABLET | Freq: Every day | ORAL | 0 refills | Status: AC

## 2024-04-01 MED ORDER — DEXAMETHASONE SODIUM PHOSPHATE 10 MG/ML IJ SOLN
INTRAMUSCULAR | Status: AC
  Filled 2024-04-01: qty 1

## 2024-04-01 MED ORDER — CYCLOBENZAPRINE HCL 5 MG PO TABS: 5.0000 mg | ORAL_TABLET | Freq: Three times a day (TID) | ORAL | 0 refills | Status: AC | PRN

## 2024-04-01 MED ORDER — KETOROLAC TROMETHAMINE 30 MG/ML IJ SOLN
30.0000 mg | Freq: Once | INTRAMUSCULAR | Status: AC
  Administered 2024-04-01: 30 mg via INTRAMUSCULAR

## 2024-04-01 MED ORDER — DEXAMETHASONE SODIUM PHOSPHATE 10 MG/ML IJ SOLN
10.0000 mg | Freq: Once | INTRAMUSCULAR | Status: AC
  Administered 2024-04-01: 10 mg via INTRAMUSCULAR
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
## 2024-04-18 ENCOUNTER — Telehealth (HOSPITAL_COMMUNITY): Payer: Self-pay

## 2024-04-18 NOTE — Telephone Encounter (Signed)
 Called pharmacy and received the following automatic message: We are having technical difficulties at this time. Please try again at a later date. Then the line disconnected. Not able to leave a voicemail.

## 2024-04-18 NOTE — Telephone Encounter (Signed)
 Called the pharmacy to inquire about medications. Was on hold and the line was disconnected after 10 minutes. Calling back now.

## 2024-04-18 NOTE — Telephone Encounter (Signed)
 Informed by front desk staff: hello. the above pt called in regards to rx pt stated she doesn't which pharmacy her rx is at. pt contact number [phone].   Medications were sent to the [facility] 04/01/2024. Patient informed and verbalized understanding. States the pharmacy keeps saying they do not have the medication.   Informed the Patient I would call ahead to make sure the pharmacy has the medication. Patient states she will wait 10-15 minutes and then call to fill the medications at the pharmacy.

## 2024-04-29 ENCOUNTER — Other Ambulatory Visit: Payer: Self-pay | Admitting: Surgery

## 2024-04-29 ENCOUNTER — Ambulatory Visit
Admission: RE | Admit: 2024-04-29 | Discharge: 2024-04-29 | Disposition: A | Discharge status: Home / Self Care | Payer: Self-pay | Source: Ambulatory Visit | Attending: Surgery | Admitting: Surgery

## 2024-04-29 ENCOUNTER — Ambulatory Visit
Admission: RE | Admit: 2024-04-29 | Discharge: 2024-04-29 | Disposition: A | Discharge status: Home / Self Care | Payer: Self-pay | Attending: Surgery | Admitting: Surgery

## 2024-04-29 DIAGNOSIS — M5412 Radiculopathy, cervical region: Secondary | ICD-10-CM | POA: Insufficient documentation

## 2024-04-29 DIAGNOSIS — M5416 Radiculopathy, lumbar region: Secondary | ICD-10-CM | POA: Insufficient documentation

## 2024-05-15 ENCOUNTER — Other Ambulatory Visit: Payer: Self-pay | Admitting: Physician Assistant

## 2024-05-15 DIAGNOSIS — Z1231 Encounter for screening mammogram for malignant neoplasm of breast: Secondary | ICD-10-CM

## 2024-05-17 ENCOUNTER — Ambulatory Visit
Admission: RE | Admit: 2024-05-17 | Discharge: 2024-05-17 | Disposition: A | Discharge status: Home / Self Care | Source: Ambulatory Visit | Attending: Physician Assistant | Admitting: Physician Assistant

## 2024-05-17 DIAGNOSIS — Z1231 Encounter for screening mammogram for malignant neoplasm of breast: Secondary | ICD-10-CM
